# Patient Record
Sex: Female | Born: 1960 | Race: Black or African American | Hispanic: No | Marital: Married | Smoking: Never smoker
Health system: Southern US, Community
[De-identification: ages and names within clinical notes are randomized; demographics above are authoritative.]

## PROBLEM LIST (undated history)

## (undated) HISTORY — PX: TUBAL LIGATION: SHX77

---

## 2001-07-02 ENCOUNTER — Ambulatory Visit (HOSPITAL_COMMUNITY): Admission: RE | Admit: 2001-07-02 | Discharge: 2001-07-02 | Payer: Self-pay

## 2001-09-14 ENCOUNTER — Ambulatory Visit (HOSPITAL_COMMUNITY): Admission: RE | Admit: 2001-09-14 | Discharge: 2001-09-14 | Payer: Self-pay

## 2001-12-23 ENCOUNTER — Ambulatory Visit (HOSPITAL_COMMUNITY): Admission: RE | Admit: 2001-12-23 | Discharge: 2001-12-23 | Payer: Self-pay

## 2002-01-21 ENCOUNTER — Encounter (HOSPITAL_COMMUNITY): Admission: AD | Admit: 2002-01-21 | Discharge: 2002-02-05 | Payer: Self-pay

## 2002-02-08 ENCOUNTER — Inpatient Hospital Stay (HOSPITAL_COMMUNITY): Admission: AD | Admit: 2002-02-08 | Discharge: 2002-02-10 | Payer: Self-pay

## 2002-04-16 ENCOUNTER — Ambulatory Visit (HOSPITAL_COMMUNITY): Admission: RE | Admit: 2002-04-16 | Discharge: 2002-04-16 | Payer: Self-pay

## 2013-02-17 ENCOUNTER — Ambulatory Visit: Payer: Self-pay

## 2013-02-17 ENCOUNTER — Ambulatory Visit: Payer: Self-pay | Admitting: Emergency Medicine

## 2013-02-17 VITALS — BP 114/78 | HR 67 | Temp 97.6°F | Resp 16 | Ht 66.25 in | Wt 172.6 lb

## 2013-02-17 DIAGNOSIS — Z111 Encounter for screening for respiratory tuberculosis: Secondary | ICD-10-CM

## 2013-02-17 DIAGNOSIS — Z0289 Encounter for other administrative examinations: Secondary | ICD-10-CM

## 2013-02-17 NOTE — Progress Notes (Signed)
Subjective:    Patient ID: Traci Grant, female    DOB: October 30, 1960, 52 y.o.   MRN: [id number]  HPI 52 year old female presents for administrative physical for pre-employment. 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 She was born in [place] and has hx of BCG vaccine. No known medical problems or daily medications.   Hepatitis B and MMR up to date.  Last tetanus >10 years ago.     Review of Systems  Constitutional: Negative for fever, chills and unexpected weight change.  Respiratory: Negative for cough.   Cardiovascular: Negative for chest pain.  Gastrointestinal: Negative for abdominal pain.  Skin: Negative for rash.  Neurological: Negative for dizziness and headaches.       Objective:   Physical Exam  Constitutional: She is oriented to person, place, and time. She appears well-developed and well-nourished.  HENT:  Head: Normocephalic and atraumatic.  Right Ear: Hearing, tympanic membrane, external ear and ear canal normal.  Left Ear: Hearing, tympanic membrane, external ear and ear canal normal.  Mouth/Throat: Uvula is midline, oropharynx is clear and moist and mucous membranes are normal.  Eyes: Conjunctivae and EOM are normal. Pupils are equal, round, and reactive to light.  Neck: Normal range of motion. Neck supple.  Cardiovascular: Normal rate, regular rhythm and normal heart sounds.   Pulmonary/Chest: Effort normal and breath sounds normal.  Musculoskeletal: Normal range of motion.       Cervical back: Normal.       Thoracic back: Normal.       Lumbar back: Normal.  Lymphadenopathy:    She has no cervical adenopathy.  Neurological: She is alert and oriented to person, place, and time.  Reflex Scores:      Patellar reflexes are 2+ on the right side and 2+ on the left side. Psychiatric: She has a normal mood and affect. Her behavior is normal. Judgment and thought content normal.   Tuberculosis Risk  Questionnaire  1. Yes ([place]) Were you born outside the [place] in one of the following parts of the world: [place], [place], [place], [place] or [place]?    2. No Have you traveled outside the [place] and lived for more than one month in one of the following parts of the world: [place], [place], [place], [place] or [place]?    3. No Do you have a compromised immune system such as from any of the following conditions:HIV/AIDS, organ or bone marrow transplantation, diabetes, immunosuppressive medicines (e.g. Prednisone, Remicaide), leukemia, lymphoma, cancer of the head or neck, gastrectomy or jejunal bypass, end-stage renal disease (on dialysis), or silicosis?     4. No Have you ever or do you plan on working in: a residential care center, a health care facility, a jail or prison or homeless shelter?    5. No Have you ever: injected illegal drugs, used crack cocaine, lived in a homeless shelter  or been in jail or prison?     6. No Have you ever been exposed to anyone with infectious tuberculosis?    Tuberculosis Symptom Questionnaire  Do you currently have any of the following symptoms?  1. No Unexplained cough lasting more than 3 weeks?   2. No Unexplained fever lasting more than 3 weeks.   3. No Night Sweats (sweating that leaves the bedclothes and sheets wet)     4. No Shortness of Breath   5. No Chest Pain  6. No Unintentional weight loss    7. No Unexplained fatigue (very tired for no reason)      [facility] reading (PRIMARY) by  Dr. Cleta Alberts as no evidence of active TB.       Assessment & Plan:   Other general medical examination for administrative purposes  Screening for tuberculosis - Plan: DG Chest 1 View  Patient declined Tdap today due to cost. Will plan to get this once she has insurance. Understands that her employer may require this before starting. If needed she may return for immunization only.  CXR negative. Letter provided to  patient and forms completed

## 2014-05-10 ENCOUNTER — Ambulatory Visit (INDEPENDENT_AMBULATORY_CARE_PROVIDER_SITE_OTHER): Payer: BC Managed Care – PPO | Admitting: Emergency Medicine

## 2014-05-10 VITALS — BP 128/86 | HR 80 | Temp 97.9°F | Resp 16 | Ht 66.0 in | Wt 167.8 lb

## 2014-05-10 DIAGNOSIS — M25512 Pain in left shoulder: Secondary | ICD-10-CM

## 2014-05-10 DIAGNOSIS — M7552 Bursitis of left shoulder: Secondary | ICD-10-CM

## 2014-05-10 MED ORDER — METHYLPREDNISOLONE ACETATE 80 MG/ML IJ SUSP: 80.0000 mg | Freq: Once | INTRAMUSCULAR | Status: DC

## 2014-05-10 MED ORDER — NAPROXEN SODIUM 550 MG PO TABS
550.0000 mg | ORAL_TABLET | Freq: Two times a day (BID) | ORAL | Status: AC
Start: 2014-05-10 — End: 2015-05-10

## 2014-05-10 NOTE — Patient Instructions (Signed)
bBursitis Bursitis is a swelling and soreness (inflammation) of a fluid-filled sac (bursa) that overlies and protects a joint. It can be caused by injury, overuse of the joint, arthritis or infection. The joints most likely to be affected are the elbows, shoulders, hips and knees. HOME CARE INSTRUCTIONS   Apply ice to the affected area for 15-20 minutes each hour while awake for 2 days. Put the ice in a plastic bag and place a towel between the bag of ice and your skin.  Rest the injured joint as much as possible, but continue to put the joint through a full range of motion, 4 times per day. (The shoulder joint especially becomes rapidly "frozen" if not used.) When the pain lessens, begin normal slow movements and usual activities.  Only take over-the-counter or prescription medicines for pain, discomfort or fever as directed by your caregiver.  Your caregiver may recommend draining the bursa and injecting medicine into the bursa. This may help the healing process.  Follow all instructions for follow-up with your caregiver. This includes any orthopedic referrals, physical therapy and rehabilitation. Any delay in obtaining necessary care could result in a delay or failure of the bursitis to heal and chronic pain. SEEK IMMEDIATE MEDICAL CARE IF:   Your pain increases even during treatment.  You develop an oral temperature above 102 F (38.9 C) and have heat and inflammation over the involved bursa. MAKE SURE YOU:   Understand these instructions.  Will watch your condition.  Will get help right away if you are not doing well or get worse. Document Released: 03/01/2000 Document Revised: 05/27/2011 Document Reviewed: 05/24/2013 [facility] Patient Information 2015 [employer] [place]. This information is not intended to replace advice given to you by your health care provider. Make sure you discuss any questions you have with your health care provider.

## 2014-05-10 NOTE — Progress Notes (Signed)
[facility] [address], [place] [id number] [phone]  Date:  05/10/2014   Name:  Traci Grant   DOB:  1960/06/17   MRN:  [id number]  PCP:  No primary care provider on file.    Chief Complaint: Arm Pain   History of Present Illness:  Traci Grant is a 54 y.o. very pleasant female patient who presents with the following:  Patient has a several month history of pain in left shoulder Can't lay on that side and has increased pain in AM that improves with movement No history of injury or overuse. No crepitus or ecchymosis No radiation of pain. No improvement with over the counter medications or other home remedies.  Denies other complaint or health concern today.   There are no active problems to display for this patient.   No past medical history on file.  No past surgical history on file.  History  Substance Use Topics  . Smoking status: Never Smoker   . Smokeless tobacco: Not on file  . Alcohol Use: No    Family History  Problem Relation Age of Onset  . Hyperlipidemia Father   . Hypertension Father   . Cancer Sister     Allergies  Allergen Reactions  . Lactose Intolerance (Gi) Diarrhea    Medication list has been reviewed and updated.  No current outpatient prescriptions on file prior to visit.   No current facility-administered medications on file prior to visit.    Review of Systems:  As per HPI, otherwise negative.    Physical Examination: Filed Vitals:   05/10/14 1019  BP: 128/86  Pulse: 80  Temp: 97.9 F (36.6 C)  Resp: 16   Filed Vitals:   05/10/14 1019  Height: 5\' 6"  (1.676 m)  Weight: 167 lb 12.8 oz (76.114 kg)   Body mass index is 27.1 kg/(m^2). Ideal Body Weight: Weight in (lb) to have BMI = 25: 154.6   GEN: WDWN, NAD, Non-toxic, Alert & Oriented x 3 HEENT: Atraumatic, Normocephalic.  Ears and Nose: No external deformity. EXTR: No clubbing/cyanosis/edema NEURO: Normal gait.  PSYCH: Normally  interactive. Conversant. Not depressed or anxious appearing.  Calm demeanor.  Left shoulder:  Tender anterior shoulder joint.  Full PROM  Assessment and Plan: left shoulder bursitis Anaprox Local joint injection 80 depo  Signed,  Phillips OdorJeffery Shaliah Wann, MD

## 2014-08-21 ENCOUNTER — Ambulatory Visit (INDEPENDENT_AMBULATORY_CARE_PROVIDER_SITE_OTHER): Payer: BC Managed Care – PPO | Admitting: Emergency Medicine

## 2014-08-21 ENCOUNTER — Ambulatory Visit (INDEPENDENT_AMBULATORY_CARE_PROVIDER_SITE_OTHER): Payer: BC Managed Care – PPO

## 2014-08-21 VITALS — BP 124/78 | HR 71 | Temp 98.1°F | Resp 19 | Ht 66.0 in | Wt 176.2 lb

## 2014-08-21 DIAGNOSIS — M25562 Pain in left knee: Secondary | ICD-10-CM | POA: Diagnosis not present

## 2014-08-21 MED ORDER — HYDROCODONE-ACETAMINOPHEN 5-325 MG PO TABS: 1.0000 | ORAL_TABLET | ORAL | Status: DC | PRN

## 2014-08-21 NOTE — Patient Instructions (Signed)

## 2014-08-21 NOTE — Progress Notes (Signed)
Subjective:  Patient ID: Traci Grant, female    DOB: December 07, 1960  Age: 54 y.o. MRN: [id number]  CC: Leg Pain   HPI Traci Grant [facility] presents  with injuries sustained in a fall. She said that she was descending a stairway and missed a step. She fell injuring her left knee. Pain in her lateral knee. Has pain with flexion of her knee and with weightbearing. He has no effusion. Has no instability of the knee. No locking or clicking. She's not been able to obtain pain relief with her over-the-counter medication. She denies any other injury in the fall. She has no loss of consciousness syncope or dizziness contributed to fall  Outpatient Prescriptions Prior to Visit  Medication Sig Dispense Refill  . naproxen sodium (ANAPROX DS) 550 MG tablet Take 1 tablet (550 mg total) by mouth 2 (two) times daily with a meal. (Patient not taking: Reported on 08/21/2014) 40 tablet 0   Facility-Administered Medications Prior to Visit  Medication Dose Route Frequency Provider Last Rate Last Dose  . methylPREDNISolone acetate (DEPO-MEDROL) injection 80 mg  80 mg Intramuscular Once Carmelina Dane, MD        History   Social History  . Marital Status: Married    Spouse Name: N/A  . Number of Children: N/A  . Years of Education: N/A   Social History Main Topics  . Smoking status: Never Smoker   . Smokeless tobacco: Not on file  . Alcohol Use: No  . Drug Use: No  . Sexual Activity: Not on file   Other Topics Concern  . None   Social History Narrative    Family History  Problem Relation Age of Onset  . Hyperlipidemia Father   . Hypertension Father   . Cancer Sister     History reviewed. No pertinent past medical history.   Review of Systems  Constitutional: Negative for fever, chills and appetite change.  HENT: Negative for congestion, ear pain, postnasal drip, sinus pressure and sore throat.   Eyes: Negative for pain and redness.  Respiratory: Negative for cough, shortness of breath  and wheezing.   Cardiovascular: Negative for leg swelling.  Gastrointestinal: Negative for nausea, vomiting, abdominal pain, diarrhea, constipation and blood in stool.  Endocrine: Negative for polyuria.  Genitourinary: Negative for dysuria, urgency, frequency and flank pain.  Musculoskeletal: Positive for arthralgias. Negative for gait problem.  Skin: Negative for rash.  Neurological: Negative for weakness and headaches.  Psychiatric/Behavioral: Negative for confusion and decreased concentration. The patient is not nervous/anxious.     Objective:  BP 124/78 mmHg  Pulse 71  Temp(Src) 98.1 F (36.7 C) (Oral)  Resp 19  Ht  (1.676 m)  Wt 176 lb 3.2 oz (79.924 kg)  BMI 28.45 kg/m2  SpO2 99%  BP Readings from Last 3 Encounters:  08/21/14 124/78  05/10/14 128/86  02/17/13 114/78    Wt Readings from Last 3 Encounters:  08/21/14 176 lb 3.2 oz (79.924 kg)  05/10/14 167 lb 12.8 oz (76.114 kg)  02/17/13 172 lb 9.6 oz (78.291 kg)    Physical Exam  Constitutional: She is oriented to person, place, and time. She appears well-developed and well-nourished.  HENT:  Head: Normocephalic and atraumatic.  Eyes: Conjunctivae are normal. Pupils are equal, round, and reactive to light.  Neck: Normal range of motion. Neck supple.  Cardiovascular: Normal rate and regular rhythm.   Pulmonary/Chest: Effort normal. No respiratory distress.  Abdominal: Soft.  Musculoskeletal: She exhibits no edema.  Left knee: She exhibits normal range of motion, no swelling, no effusion, no ecchymosis, no deformity, no laceration, normal alignment, no LCL laxity and no MCL laxity. Tenderness found. Lateral joint line tenderness noted.  Neurological: She is alert and oriented to person, place, and time.  Skin: Skin is dry.  Psychiatric: She has a normal mood and affect. Her behavior is normal. Thought content normal.    No results found for: WBC, HGB, HCT, PLT, GLUCOSE, CHOL, TRIG, HDL, LDLDIRECT,  LDLCALC, ALT, AST, NA, K, CL, CREATININE, BUN, CO2, TSH, PSA, INR, GLUF, HGBA1C, MICROALBUR    .  Assessment & Plan:   Traci Grant was seen today for leg pain.  Diagnoses and all orders for this visit:  Pain in joint, lower leg, left   I am having [name] maintain her naproxen sodium and acetaminophen. We will continue to administer methylPREDNISolone acetate.  Meds ordered this encounter  Medications  . acetaminophen (TYLENOL) 500 MG tablet    Sig: Take 500 mg by mouth every 6 (six) hours as needed.    Appropriate red flag conditions were discussed with the patient as well as actions that should be taken.  Patient expressed his understanding.  Follow-up: No Follow-up on file.  Carmelina DaneAnderson, Christoffer Currier S, MD    [facility] reading (PRIMARY) by  Dr. Dareen PianoAnderson. Negative tib-fib..Marland Kitchen

## 2015-06-03 IMAGING — CR DG CHEST 1V
1 series · 1 of 1 positions shown · non-contrast
Comparison: None.

CLINICAL DATA: TB screening.

EXAM:
CHEST - 1 VIEW

[PA]
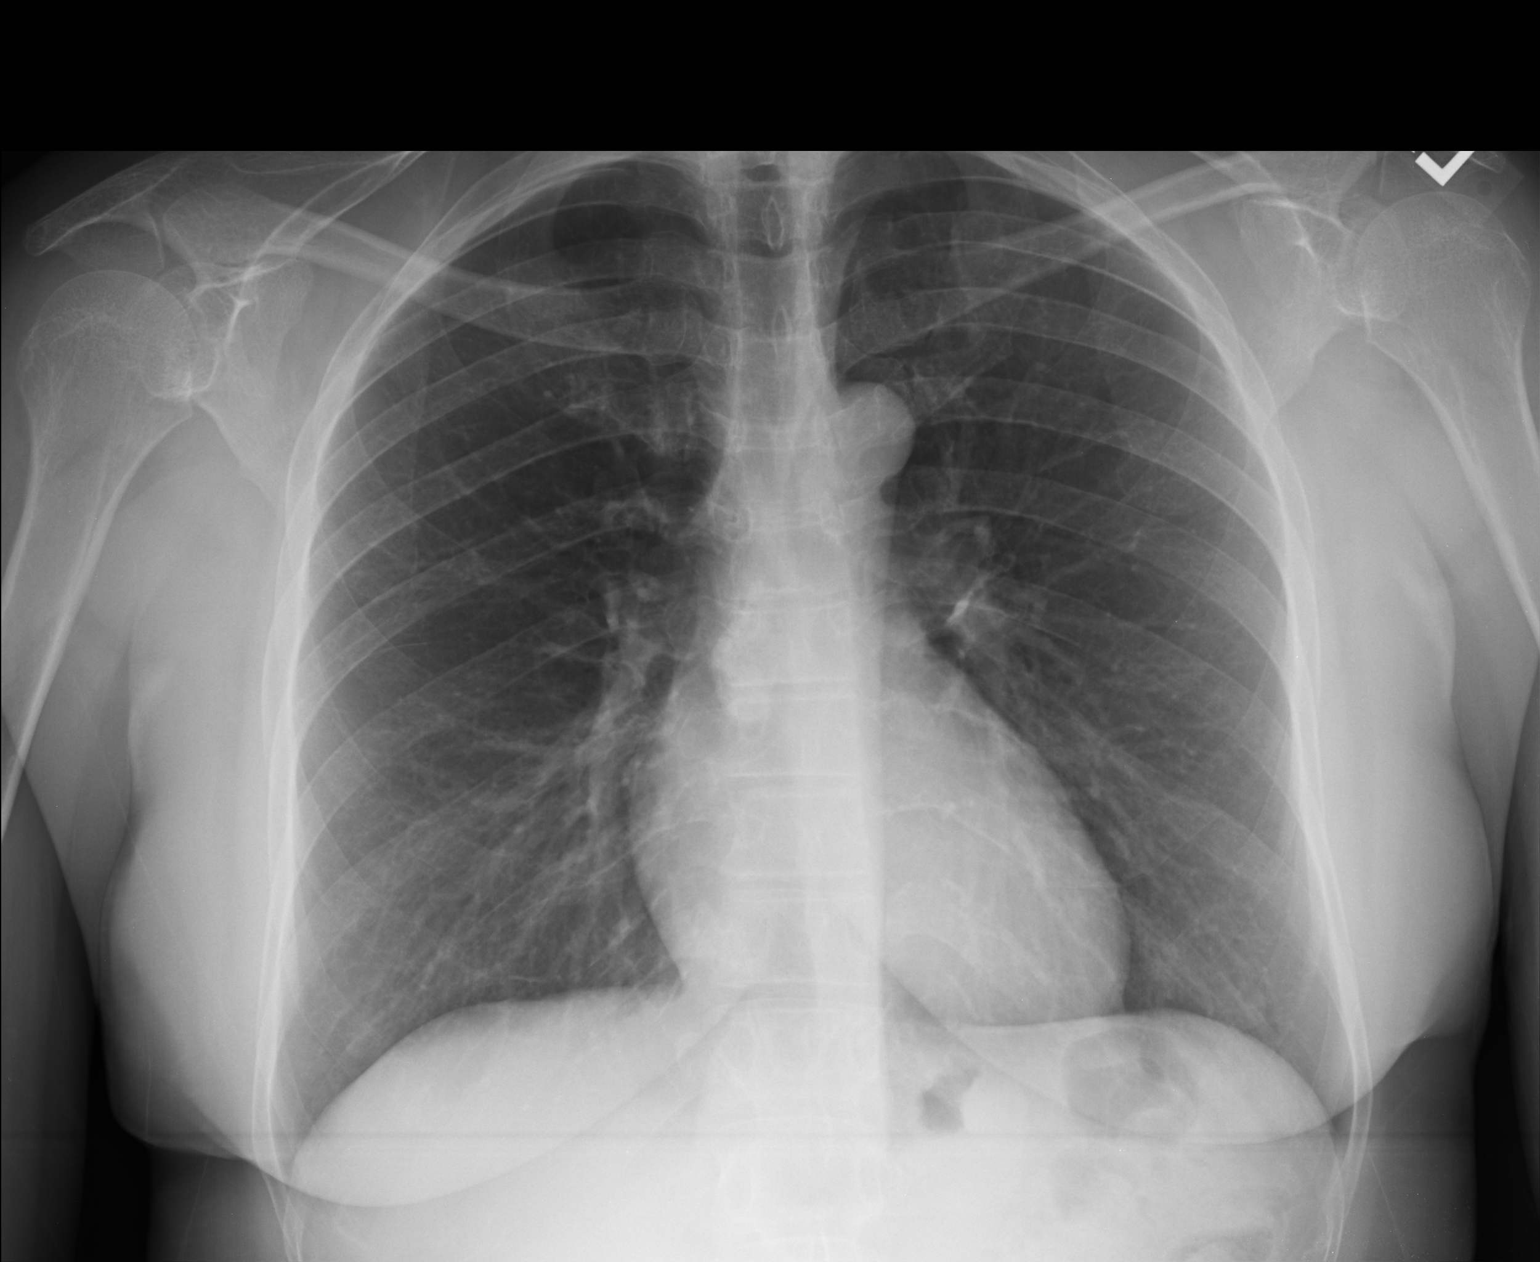

[1 of 1 positions shown; findings below may reference images not displayed]

FINDINGS: Lungs are clear. The cardiomediastinal silhouette is within normal.
Increased ovoid density in the subcarinal region right of midline
likely a calcified lymph node.
IMPRESSION: No acute cardiopulmonary disease.

Findings suggesting prior granulomatous disease.

## 2016-01-26 ENCOUNTER — Ambulatory Visit (INDEPENDENT_AMBULATORY_CARE_PROVIDER_SITE_OTHER): Payer: BC Managed Care – PPO | Admitting: Physician Assistant

## 2016-01-26 VITALS — BP 140/88 | HR 70 | Temp 98.0°F | Resp 16 | Ht 66.0 in | Wt 177.6 lb

## 2016-01-26 DIAGNOSIS — Z5321 Procedure and treatment not carried out due to patient leaving prior to being seen by health care provider: Secondary | ICD-10-CM

## 2016-01-26 NOTE — Patient Instructions (Signed)
     IF you received an x-ray today, you will receive an invoice from [facility]. Please contact [facility] at [phone] with questions or concerns regarding your invoice.   IF you received labwork today, you will receive an invoice from [facility]. Please contact [facility] at [phone] with questions or concerns regarding your invoice.   Our billing staff will not be able to assist you with questions regarding bills from these companies.  You will be contacted with the lab results as soon as they are available. The fastest way to get your results is to activate your My Chart account. Instructions are located on the last page of this paperwork. If you have not heard from us regarding the results in 2 weeks, please contact this office.      

## 2016-01-26 NOTE — Progress Notes (Signed)
Patient left without being seen. Deliah BostonMichael Clark, MS, PA-C 12:29 PM, 01/26/2016

## 2016-03-12 ENCOUNTER — Ambulatory Visit (INDEPENDENT_AMBULATORY_CARE_PROVIDER_SITE_OTHER): Payer: BC Managed Care – PPO | Admitting: Emergency Medicine

## 2016-03-12 DIAGNOSIS — M7552 Bursitis of left shoulder: Secondary | ICD-10-CM

## 2016-03-12 DIAGNOSIS — M25512 Pain in left shoulder: Secondary | ICD-10-CM

## 2016-03-12 MED ORDER — PREDNISONE 20 MG PO TABS: 40.0000 mg | ORAL_TABLET | Freq: Every day | ORAL | 0 refills | Status: AC

## 2016-03-12 MED ORDER — PREDNISONE 20 MG PO TABS: 40.0000 mg | ORAL_TABLET | Freq: Every day | ORAL | 0 refills | Status: DC

## 2016-03-12 NOTE — Patient Instructions (Addendum)
     IF you received an x-ray today, you will receive an invoice from [facility]. Please contact [facility] at [phone] with questions or concerns regarding your invoice.   IF you received labwork today, you will receive an invoice from [facility]. Please contact [facility] at [phone] with questions or concerns regarding your invoice.   Our billing staff will not be able to assist you with questions regarding bills from these companies.  You will be contacted with the lab results as soon as they are available. The fastest way to get your results is to activate your My Chart account. Instructions are located on the last page of this paperwork. If you have not heard from us regarding the results in 2 weeks, please contact this office.      Bursitis Introduction Bursitis is when the fluid-filled sac (bursa) that covers and protects a joint is swollen (inflamed). Bursitis is most common near joints, especially the knees, elbows, hips, and shoulders. Follow these instructions at home:  Take medicines only as told by your doctor.  If you were prescribed an antibiotic medicine, finish it all even if you start to feel better.  Rest the affected area as told by your doctor.  Keep the area raised up.  Avoid doing things that make the pain worse.  Apply ice to the injured area:  Place ice in a plastic bag.  Place a towel between your skin and the bag.  Leave the ice on for 20 minutes, 2-3 times a day.  Use splints, braces, pads, or walking aids as told by your doctor.  Keep all follow-up visits as told by your doctor. This is important. Contact a doctor if:  You have more pain with home care.  You have a fever.  You have chills. This information is not intended to replace advice given to you by your health care provider. Make sure you discuss any questions you have with your health care provider. Document Released: 08/22/2009 Document Revised: 08/10/2015  Document Reviewed: 05/24/2013  2017 Elsevier   Place shoulder pain patient instructions here.

## 2016-03-12 NOTE — Progress Notes (Signed)
Traci Grant is a 55 y.o. female  Shoulder Pain: Patient complaints of left shoulder pain. This is evaluated as a non injury. The pain is described as aching.  The onset of the pain was gradual, starting about 3 days ago.  The pain occurs every day and lasts 24 hours.  Location is anterior, posterior, lateral. Recurrent, symptoms are aggravated by all activities. Symptoms are diminisohed by  rest.   Limited activities include: all activities. moderate stiffness is reported. 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   No past medical history on file. No past surgical history on file.  Current Outpatient Prescriptions:  .  ibuprofen (ADVIL,MOTRIN) 200 MG tablet, Take 200 mg by mouth every 6 (six) hours as needed., Disp: , Rfl:  .  acetaminophen (TYLENOL) 500 MG tablet, Take 500 mg by mouth every 6 (six) hours as needed., Disp: , Rfl:   Current Facility-Administered Medications:  .  methylPREDNISolone acetate (DEPO-MEDROL) injection 80 mg, 80 mg, Intramuscular, Once, Carmelina DaneJeffery S Anderson, MD Allergies  Allergen Reactions  . Lactose Intolerance (Gi) Diarrhea    reports that she has never smoked. She does not have any smokeless tobacco history on file. She reports that she does not drink alcohol or use drugs. Family History  Problem Relation Age of Onset  . Hyperlipidemia Father   . Hypertension Father   . Cancer Sister   Review of Systems  Constitutional: Negative.   HENT: Negative.   Eyes: Negative.   Respiratory: Negative.   Cardiovascular: Negative.   Gastrointestinal: Negative.   Genitourinary: Negative.   Musculoskeletal: Positive for joint pain (left shoulder).  Skin: Negative.   Neurological: Negative.   Endo/Heme/Allergies: Negative.   Psychiatric/Behavioral: Negative.    Vitals:   03/12/16 1036  BP: 136/84  Pulse: 89  Resp: 16  Temp: 98.1 F (36.7 C)    Physical Exam  Constitutional: She is oriented to person, place, and time. She appears well-developed  and well-nourished.  HENT:  Head: Normocephalic and atraumatic.  Eyes: Conjunctivae and EOM are normal. Pupils are equal, round, and reactive to light.  Neck: Normal range of motion. Neck supple.  Cardiovascular: Normal rate, regular rhythm, normal heart sounds and intact distal pulses.   Pulmonary/Chest: Effort normal and breath sounds normal.  Abdominal: Soft.  Musculoskeletal:  Left shoulder: tender, no erythema, very limited ROM  Neurological: She is alert and oriented to person, place, and time.  Skin: Skin is warm and dry.  Psychiatric: She has a normal mood and affect. Her behavior is normal.    Traci Grant was seen today for shoulder pain.  Diagnoses and all orders for this visit:  Bursitis of left shoulder -     Ambulatory referral to Orthopedics -     Discontinue: predniSONE (DELTASONE) 20 MG tablet; Take 2 tablets (40 mg total) by mouth daily with breakfast. -     Care order/instruction: -     Sling immobilizer -     predniSONE (DELTASONE) 20 MG tablet; Take 2 tablets (40 mg total) by mouth daily with breakfast.  Acute pain of left shoulder   Left shoulder bursitis Bursitis instructions given. Start Prednisone 40mg  day x 5 days. F/U with Orthopedist.

## 2016-04-03 ENCOUNTER — Ambulatory Visit (INDEPENDENT_AMBULATORY_CARE_PROVIDER_SITE_OTHER): Payer: BC Managed Care – PPO | Admitting: Orthopedic Surgery

## 2016-12-04 IMAGING — CR DG TIBIA/FIBULA 2V*L*
2 series · 2 of 2 positions shown · non-contrast
Comparison: None.

CLINICAL DATA: Left knee pain secondary to a fall on stairs.

EXAM:
LEFT TIBIA AND FIBULA - 2 VIEW

[AP]
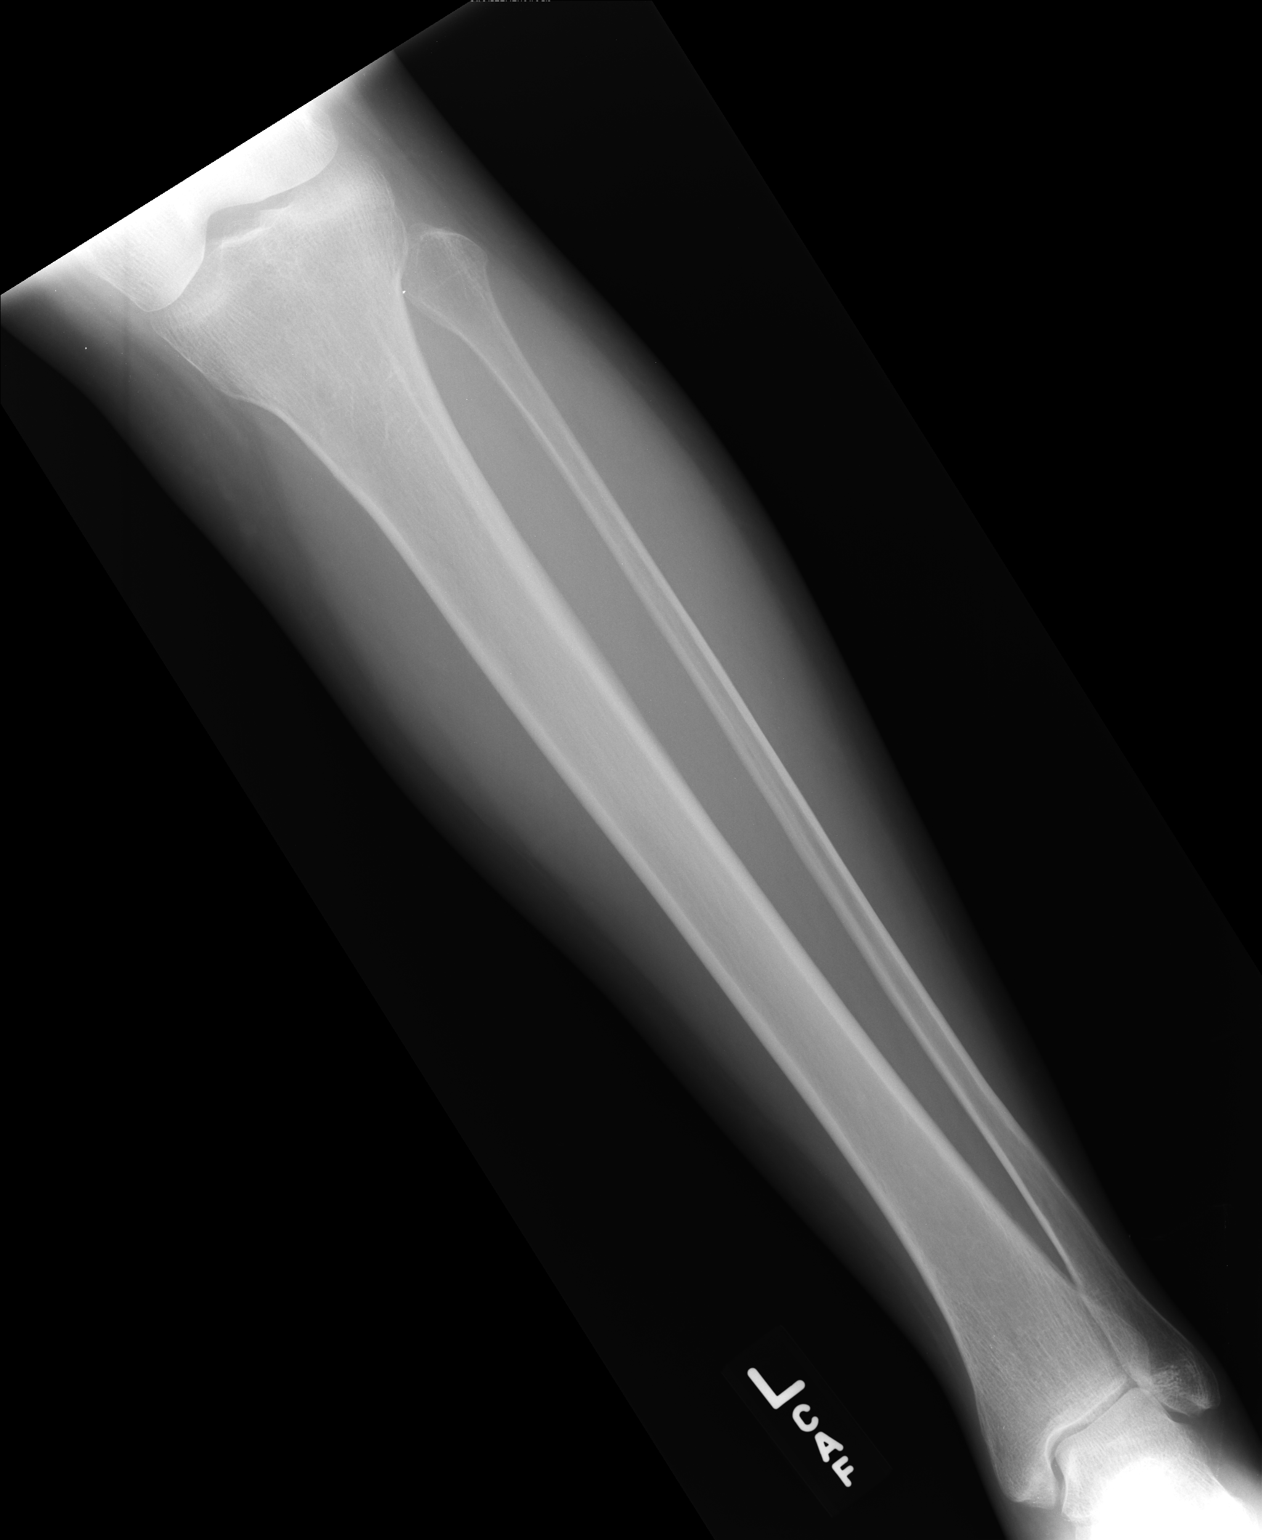

[lateral]
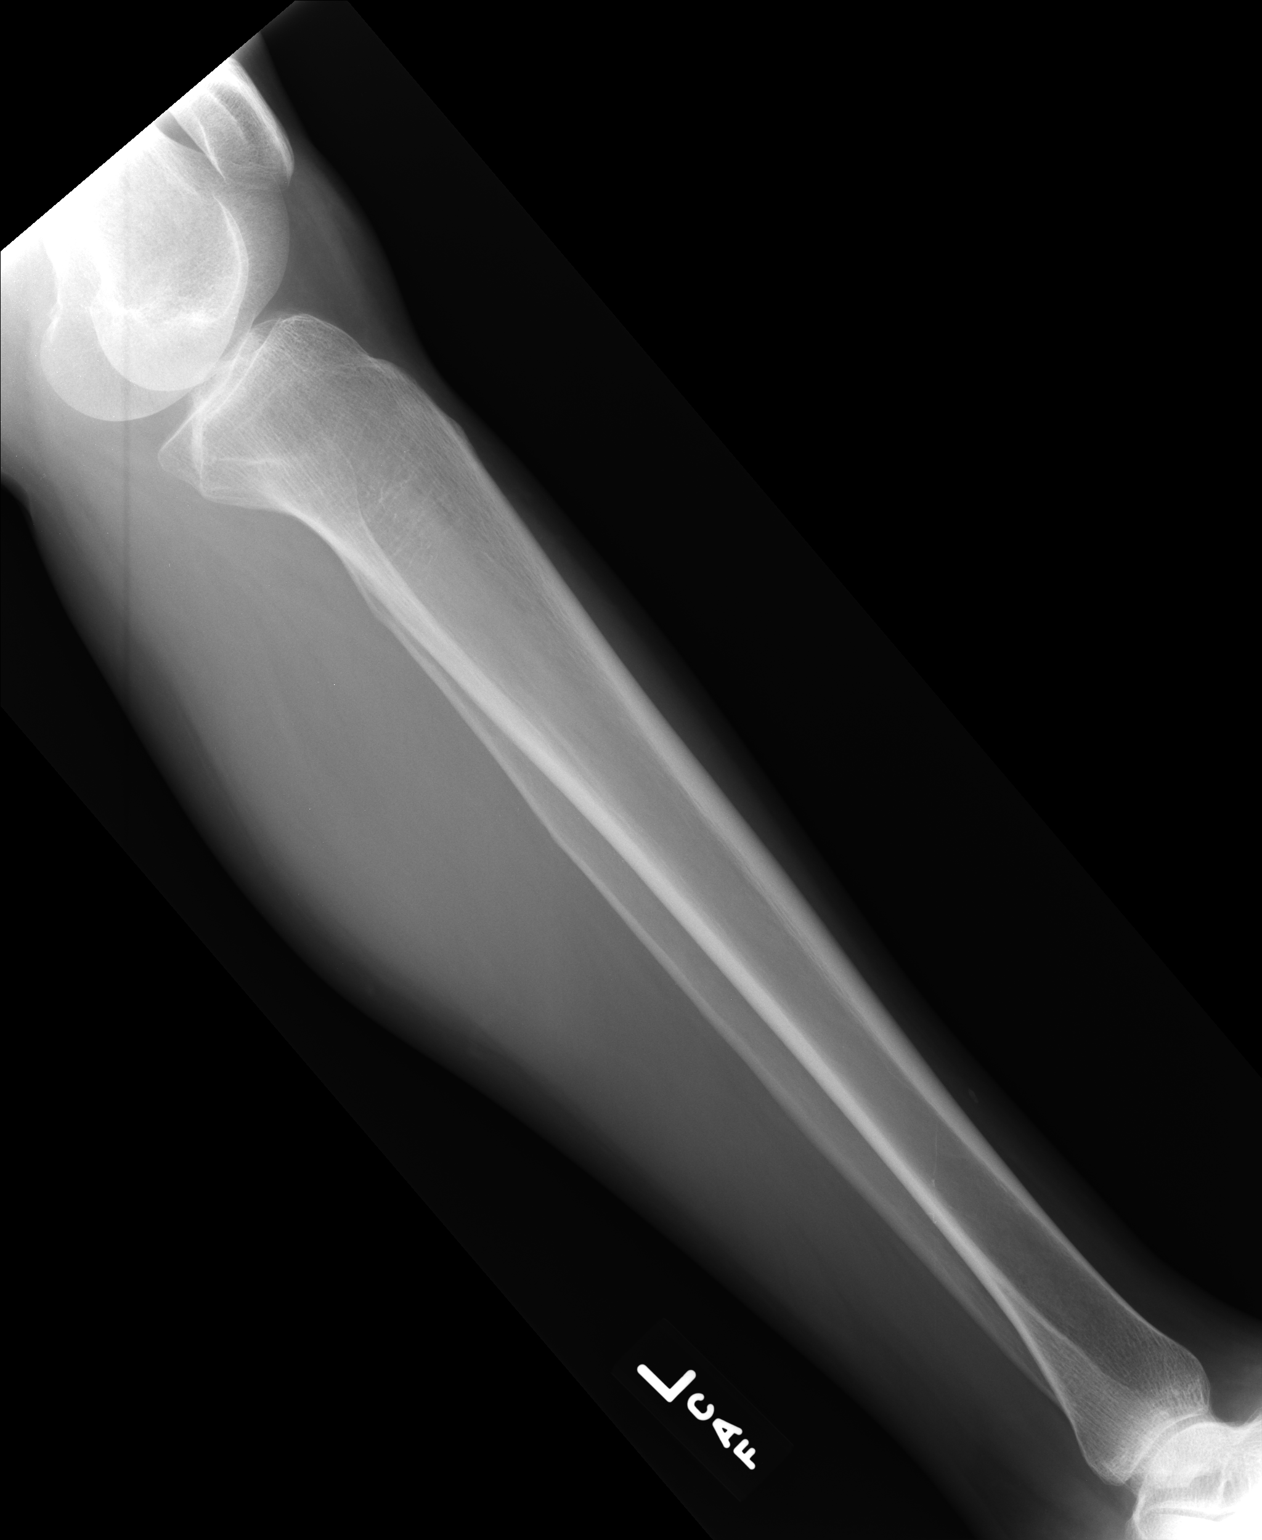

[2 of 2 positions shown; findings below may reference images not displayed]

FINDINGS: There is no evidence of fracture or other focal bone lesions. Soft
tissues are unremarkable.
IMPRESSION: Normal exam.

## 2017-01-29 ENCOUNTER — Ambulatory Visit: Payer: BC Managed Care – PPO | Admitting: Emergency Medicine

## 2017-01-29 ENCOUNTER — Encounter: Payer: Self-pay | Admitting: Emergency Medicine

## 2017-01-29 ENCOUNTER — Other Ambulatory Visit: Payer: Self-pay

## 2017-01-29 VITALS — BP 112/78 | HR 71 | Temp 98.3°F | Resp 16 | Ht 65.5 in | Wt 183.8 lb

## 2017-01-29 DIAGNOSIS — S91332A Puncture wound without foreign body, left foot, initial encounter: Secondary | ICD-10-CM

## 2017-01-29 DIAGNOSIS — Z23 Encounter for immunization: Secondary | ICD-10-CM | POA: Diagnosis not present

## 2017-01-29 MED ORDER — CIPROFLOXACIN HCL 500 MG PO TABS: 500.0000 mg | ORAL_TABLET | Freq: Two times a day (BID) | ORAL | 0 refills | Status: DC

## 2017-01-29 NOTE — Progress Notes (Signed)
Traci Grant 56 y.o.   Chief Complaint  Patient presents with  . Foot Injury    LEFT -per patient stepped on nail last night and wants Td or Tdap    HISTORY OF PRESENT ILLNESS: This is a 56 y.o. female complaining of puncture wound to left foot last night; needs Td.  HPI   Prior to Admission medications   Medication Sig Start Date End Date Taking? Authorizing Provider  ibuprofen (ADVIL,MOTRIN) 200 MG tablet Take 200 mg by mouth every 6 (six) hours as needed.   Yes [provider]  acetaminophen (TYLENOL) 500 MG tablet Take 500 mg by mouth every 6 (six) hours as needed.    [provider]    Allergies  Allergen Reactions  . Lactose Intolerance (Gi) Diarrhea    There are no active problems to display for this patient.   No past medical history on file.  No past surgical history on file.  Social History   Socioeconomic History  . Marital status: Married    Spouse name: Not on file  . Number of children: Not on file  . Years of education: Not on file  . Highest education level: Not on file  Social Needs  . Financial resource strain: Not on file  . Food insecurity - worry: Not on file  . Food insecurity - inability: Not on file  . Transportation needs - medical: Not on file  . Transportation needs - non-medical: Not on file  Occupational History  . Not on file  Tobacco Use  . Smoking status: Never Smoker  . Smokeless tobacco: Never Used  Substance and Sexual Activity  . Alcohol use: No  . Drug use: No  . Sexual activity: Not on file  Other Topics Concern  . Not on file  Social History Narrative  . Not on file    Family History  Problem Relation Age of Onset  . Hyperlipidemia Father   . Hypertension Father   . Cancer Sister      Review of Systems  Constitutional: Negative.  Negative for chills and fever.  Respiratory: Negative for shortness of breath.   Gastrointestinal: Negative for nausea and vomiting.  Neurological: Negative  for dizziness and headaches.  All other systems reviewed and are negative.  Vitals:   01/29/17 1652  BP: 112/78  Pulse: 71  Resp: 16  Temp: 98.3 F (36.8 C)  SpO2: 99%     Physical Exam  Constitutional: She is oriented to person, place, and time. She appears well-developed and well-nourished.  HENT:  Head: Normocephalic and atraumatic.  Eyes: EOM are normal. Pupils are equal, round, and reactive to light.  Neck: Normal range of motion.  Cardiovascular: Normal rate.  Pulmonary/Chest: Effort normal.  Musculoskeletal:  Left foot: +puncture wound distal plantar surface 1-2 MTP area  Neurological: She is alert and oriented to person, place, and time.  Skin: Skin is warm. Capillary refill takes less than 2 seconds.  Psychiatric: She has a normal mood and affect. Her behavior is normal.  Vitals reviewed.    ASSESSMENT & PLAN: Traci Grant was seen today for foot injury.  Diagnoses and all orders for this visit:  Puncture wound of left foot, initial encounter -     Tdap vaccine greater than or equal to 7yo IM  Need for Tdap vaccination -     Tdap vaccine greater than or equal to 7yo IM  Other orders -     ciprofloxacin (CIPRO) 500 MG tablet; Take 1 tablet (  500 mg total) 2 (two) times daily by mouth.    Patient Instructions   We recommend that you schedule a mammogram for breast cancer screening. Typically, you do not need a referral to do this. Please contact a local imaging center to schedule your mammogram.  [facility] - [phone]  *ask for the Radiology Department [facility]) - [phone]) [phone] or [phone]  [facility] - [phone] [facility] - [phone]) [phone] [facility] - [phone]  *ask for the Radiology Department [facility] - [phone]  *ask for the Radiology Department [facility] - [phone]  *ask for the Mammography Department [facility]  - [phone]    IF you received an x-ray today, you will receive an invoice from [facility]. Please contact [facility] at [phone] with questions or concerns regarding your invoice.   IF you received labwork today, you will receive an invoice from [facility]. Please contact [facility] at [phone] with questions or concerns regarding your invoice.   Our billing staff will not be able to assist you with questions regarding bills from these companies.  You will be contacted with the lab results as soon as they are available. The fastest way to get your results is to activate your My Chart account. Instructions are located on the last page of this paperwork. If you have not heard from us regarding the results in 2 weeks, please contact this office.     Puncture Wound A puncture wound is an injury that is caused by a sharp, thin object that goes through your skin, such as a nail. A puncture wound usually does not leave a large opening in your skin, so it may not bleed a lot. However, when you get a puncture wound, dirt or other materials (foreign bodies) can be forced into your wound and break off inside. This makes it more likely that an infection will happen, such as tetanus. Follow these instructions at home: Medicines  Take or apply over-the-counter and prescription medicines only as told by your doctor.  If you were prescribed an antibiotic medicine, take or apply it as told by your doctor. Do not stop using the antibiotic even if your condition starts to get better. Wound care  There are many ways to close and cover a wound. For example, a wound can be covered with stitches (sutures), skin glue, or adhesive strips. Follow instructions from your doctor about: ? How to take care of your wound. ? When and how you should change your bandage (dressing). ? When you should remove your bandage. ? Removing whatever was used to close your wound.  Keep the bandage dry  as told by your doctor. Do not take baths, swim, use a hot tub, or do anything that would put your wound underwater until your doctor says it is okay.  Clean the wound as told by your doctor.  Do not scratch or pick at the wound.  Check your wound every day for signs of infection. Watch for: ? Redness, swelling, or pain. ? Fluid, blood, or pus. General instructions  Raise (elevate) the injured area above the level of your heart while you are sitting or lying down.  If your puncture wound is in your foot, ask your doctor if you need to avoid putting weight on your foot and for how long.  Keep all follow-up visits as told by your doctor. This is important. Contact a doctor if:  You got a tetanus shot and you have any of these problems at the injection site: ? Swelling. ? Very bad pain. ? Redness. ? Bleeding.  You have a fever.  Your stitches come out.  You notice a bad smell coming from your wound or your bandage.  You notice something coming out of the wound, such as wood or glass.  Medicine does not help your pain.  You have more redness, swelling, or pain at the site of your wound.  You have fluid, blood, or pus coming from your wound.  You notice a change in the color of your skin near your wound.  You need to change the bandage often because fluid, blood, or pus is coming from the wound.  You start to have a new rash.  You start to have numbness around the wound. Get help right away if:  You have very bad swelling around the wound.  Your pain suddenly gets worse and is very bad.  You start to get painful skin lumps.  You have a red streak going away from your wound.  The wound is on your hand or foot and you cannot move a finger or toe like you usually can.  The wound is on your hand or foot and you notice that your fingers or toes look pale or bluish. This information is not intended to replace advice given to you by your health care provider. Make sure  you discuss any questions you have with your health care provider. Document Released: 12/12/2007 Document Revised: 08/10/2015 Document Reviewed: 04/27/2014 Elsevier Interactive Patient Education  2018 Elsevier Inc.      Edwina Barth, MD [facility]

## 2017-01-29 NOTE — Patient Instructions (Addendum)
We recommend that you schedule a mammogram for breast cancer screening. Typically, you do not need a referral to do this. Please contact a local imaging center to schedule your mammogram.  [facility] - [phone]  *ask for the Radiology Department [facility]) - [phone](336) [phone] or [phone]) [phone]  [facility] - [phone] [facility] - [phone] [facility] - [phone]) [phone]  *ask for the Radiology Department [facility] - [phone]  *ask for the Radiology Department [facility] - [phone]) [phone]  *ask for the Mammography Department [facility] - [phone]    IF you received an x-ray today, you will receive an invoice from [facility]. Please contact [facility] at [phone] with questions or concerns regarding your invoice.   IF you received labwork today, you will receive an invoice from [facility]. Please contact [facility] at [phone] with questions or concerns regarding your invoice.   Our billing staff will not be able to assist you with questions regarding bills from these companies.  You will be contacted with the lab results as soon as they are available. The fastest way to get your results is to activate your My Chart account. Instructions are located on the last page of this paperwork. If you have not heard from us regarding the results in 2 weeks, please contact this office.     Puncture Wound A puncture wound is an injury that is caused by a sharp, thin object that goes through your skin, such as a nail. A puncture wound usually does not leave a large opening in your skin, so it may not bleed a lot. However, when you get a puncture wound, dirt or other materials (foreign bodies) can be forced into your wound and break off inside. This makes it more likely that an infection will happen, such as tetanus. Follow these instructions at  home: Medicines  Take or apply over-the-counter and prescription medicines only as told by your doctor.  If you were prescribed an antibiotic medicine, take or apply it as told by your doctor. Do not stop using the antibiotic even if your condition starts to get better. Wound care  There are many ways to close and cover a wound. For example, a wound can be covered with stitches (sutures), skin glue, or adhesive strips. Follow instructions from your doctor about: ? How to take care of your wound. ? When and how you should change your bandage (dressing). ? When you should remove your bandage. ? Removing whatever was used to close your wound.  Keep the bandage dry as told by your doctor. Do not take baths, swim, use a hot tub, or do anything that would put your wound underwater until your doctor says it is okay.  Clean the wound as told by your doctor.  Do not scratch or pick at the wound.  Check your wound every day for signs of infection. Watch for: ? Redness, swelling, or pain. ? Fluid, blood, or pus. General instructions  Raise (elevate) the injured area above the level of your heart while you are sitting or lying down.  If your puncture wound is in your foot, ask your doctor if you need to avoid putting weight on your foot and for how long.  Keep all follow-up visits as told by your doctor. This is important. Contact a doctor if:  You got a tetanus shot and you have any of these problems at the injection site: ?  Swelling. ? Very bad pain. ? Redness. ? Bleeding.  You have a fever.  Your stitches come out.  You notice a bad smell coming from your wound or your bandage.  You notice something coming out of the wound, such as wood or glass.  Medicine does not help your pain.  You have more redness, swelling, or pain at the site of your wound.  You have fluid, blood, or pus coming from your wound.  You notice a change in the color of your skin near your wound.  You  need to change the bandage often because fluid, blood, or pus is coming from the wound.  You start to have a new rash.  You start to have numbness around the wound. Get help right away if:  You have very bad swelling around the wound.  Your pain suddenly gets worse and is very bad.  You start to get painful skin lumps.  You have a red streak going away from your wound.  The wound is on your hand or foot and you cannot move a finger or toe like you usually can.  The wound is on your hand or foot and you notice that your fingers or toes look pale or bluish. This information is not intended to replace advice given to you by your health care provider. Make sure you discuss any questions you have with your health care provider. Document Released: 12/12/2007 Document Revised: 08/10/2015 Document Reviewed: 04/27/2014 Elsevier Interactive Patient Education  [employer]2018 [employer]

## 2017-03-21 ENCOUNTER — Ambulatory Visit: Payer: BC Managed Care – PPO | Admitting: Emergency Medicine

## 2017-03-25 ENCOUNTER — Encounter: Payer: BC Managed Care – PPO | Admitting: Emergency Medicine

## 2017-03-28 ENCOUNTER — Other Ambulatory Visit: Payer: Self-pay

## 2017-03-28 ENCOUNTER — Telehealth: Payer: Self-pay | Admitting: *Deleted

## 2017-03-28 ENCOUNTER — Encounter: Payer: Self-pay | Admitting: Emergency Medicine

## 2017-03-28 ENCOUNTER — Ambulatory Visit (INDEPENDENT_AMBULATORY_CARE_PROVIDER_SITE_OTHER): Payer: BC Managed Care – PPO | Admitting: Emergency Medicine

## 2017-03-28 VITALS — BP 130/70 | HR 79 | Temp 98.3°F | Resp 16 | Ht 65.75 in | Wt 177.4 lb

## 2017-03-28 DIAGNOSIS — Z0000 Encounter for general adult medical examination without abnormal findings: Secondary | ICD-10-CM | POA: Diagnosis not present

## 2017-03-28 NOTE — Patient Instructions (Addendum)
   IF you received an x-ray today, you will receive an invoice from [facility]. Please contact [facility] at [phone] with questions or concerns regarding your invoice.   IF you received labwork today, you will receive an invoice from [facility]. Please contact [facility] at [phone] with questions or concerns regarding your invoice.   Our billing staff will not be able to assist you with questions regarding bills from these companies.  You will be contacted with the lab results as soon as they are available. The fastest way to get your results is to activate your My Chart account. Instructions are located on the last page of this paperwork. If you have not heard from us regarding the results in 2 weeks, please contact this office.     Health Maintenance, Female Adopting a healthy lifestyle and getting preventive care can go a long way to promote health and wellness. Talk with your health care provider about what schedule of regular examinations is right for you. This is a good chance for you to check in with your provider about disease prevention and staying healthy. In between checkups, there are plenty of things you can do on your own. Experts have done a lot of research about which lifestyle changes and preventive measures are most likely to keep you healthy. Ask your health care provider for more information. Weight and diet Eat a healthy diet  Be sure to include plenty of vegetables, fruits, low-fat dairy products, and lean protein.  Do not eat a lot of foods high in solid fats, added sugars, or salt.  Get regular exercise. This is one of the most important things you can do for your health. ? Most adults should exercise for at least 150 minutes each week. The exercise should increase your heart rate and make you sweat (moderate-intensity exercise). ? Most adults should also do strengthening exercises at least twice a week. This is in addition to the  moderate-intensity exercise.  Maintain a healthy weight  Body mass index (BMI) is a measurement that can be used to identify possible weight problems. It estimates body fat based on height and weight. Your health care provider can help determine your BMI and help you achieve or maintain a healthy weight.  For females 20 years of age and older: ? A BMI below 18.5 is considered underweight. ? A BMI of 18.5 to 24.9 is normal. ? A BMI of 25 to 29.9 is considered overweight. ? A BMI of 30 and above is considered obese.  Watch levels of cholesterol and blood lipids  You should start having your blood tested for lipids and cholesterol at 56 years of age, then have this test every 5 years.  You may need to have your cholesterol levels checked more often if: ? Your lipid or cholesterol levels are high. ? You are older than 56 years of age. ? You are at high risk for heart disease.  Cancer screening Lung Cancer  Lung cancer screening is recommended for adults 55-80 years old who are at high risk for lung cancer because of a history of smoking.  A yearly low-dose CT scan of the lungs is recommended for people who: ? Currently smoke. ? Have quit within the past 15 years. ? Have at least a 30-pack-year history of smoking. A pack year is smoking an average of one pack of cigarettes a day for 1 year.  Yearly screening should continue until it has been 15 years since you quit.  Yearly   screening should stop if you develop a health problem that would prevent you from having lung cancer treatment.  Breast Cancer  Practice breast self-awareness. This means understanding how your breasts normally appear and feel.  It also means doing regular breast self-exams. Let your health care provider know about any changes, no matter how small.  If you are in your 20s or 30s, you should have a clinical breast exam (CBE) by a health care provider every 1-3 years as part of a regular health exam.  If you  are 73 or older, have a CBE every year. Also consider having a breast X-ray (mammogram) every year.  If you have a family history of breast cancer, talk to your health care provider about genetic screening.  If you are at high risk for breast cancer, talk to your health care provider about having an MRI and a mammogram every year.  Breast cancer gene (BRCA) assessment is recommended for women who have family members with BRCA-related cancers. BRCA-related cancers include: ? Breast. ? Ovarian. ? Tubal. ? Peritoneal cancers.  Results of the assessment will determine the need for genetic counseling and BRCA1 and BRCA2 testing.  Cervical Cancer Your health care provider may recommend that you be screened regularly for cancer of the pelvic organs (ovaries, uterus, and vagina). This screening involves a pelvic examination, including checking for microscopic changes to the surface of your cervix (Pap test). You may be encouraged to have this screening done every 3 years, beginning at age 61.  For women ages 90-65, health care providers may recommend pelvic exams and Pap testing every 3 years, or they may recommend the Pap and pelvic exam, combined with testing for human papilloma virus (HPV), every 5 years. Some types of HPV increase your risk of cervical cancer. Testing for HPV may also be done on women of any age with unclear Pap test results.  Other health care providers may not recommend any screening for nonpregnant women who are considered low risk for pelvic cancer and who do not have symptoms. Ask your health care provider if a screening pelvic exam is right for you.  If you have had past treatment for cervical cancer or a condition that could lead to cancer, you need Pap tests and screening for cancer for at least 20 years after your treatment. If Pap tests have been discontinued, your risk factors (such as having a new sexual partner) need to be reassessed to determine if screening should  resume. Some women have medical problems that increase the chance of getting cervical cancer. In these cases, your health care provider may recommend more frequent screening and Pap tests.  Colorectal Cancer  This type of cancer can be detected and often prevented.  Routine colorectal cancer screening usually begins at 56 years of age and continues through 56 years of age.  Your health care provider may recommend screening at an earlier age if you have risk factors for colon cancer.  Your health care provider may also recommend using home test kits to check for hidden blood in the stool.  A small camera at the end of a tube can be used to examine your colon directly (sigmoidoscopy or colonoscopy). This is done to check for the earliest forms of colorectal cancer.  Routine screening usually begins at age 67.  Direct examination of the colon should be repeated every 5-10 years through 56 years of age. However, you may need to be screened more often if early forms of precancerous polyps  or small growths are found.  Skin Cancer  Check your skin from head to toe regularly.  Tell your health care provider about any new moles or changes in moles, especially if there is a change in a mole's shape or color.  Also tell your health care provider if you have a mole that is larger than the size of a pencil eraser.  Always use sunscreen. Apply sunscreen liberally and repeatedly throughout the day.  Protect yourself by wearing long sleeves, pants, a wide-brimmed hat, and sunglasses whenever you are outside.  Heart disease, diabetes, and high blood pressure  High blood pressure causes heart disease and increases the risk of stroke. High blood pressure is more likely to develop in: ? People who have blood pressure in the high end of the normal range (130-139/85-89 mm Hg). ? People who are overweight or obese. ? People who are African American.  If you are 18-39 years of age, have your blood  pressure checked every 3-5 years. If you are 40 years of age or older, have your blood pressure checked every year. You should have your blood pressure measured twice-once when you are at a hospital or clinic, and once when you are not at a hospital or clinic. Record the average of the two measurements. To check your blood pressure when you are not at a hospital or clinic, you can use: ? An automated blood pressure machine at a pharmacy. ? A home blood pressure monitor.  If you are between 55 years and 79 years old, ask your health care provider if you should take aspirin to prevent strokes.  Have regular diabetes screenings. This involves taking a blood sample to check your fasting blood sugar level. ? If you are at a normal weight and have a low risk for diabetes, have this test once every three years after 56 years of age. ? If you are overweight and have a high risk for diabetes, consider being tested at a younger age or more often. Preventing infection Hepatitis B  If you have a higher risk for hepatitis B, you should be screened for this virus. You are considered at high risk for hepatitis B if: ? You were born in a country where hepatitis B is common. Ask your health care provider which countries are considered high risk. ? Your parents were born in a high-risk country, and you have not been immunized against hepatitis B (hepatitis B vaccine). ? You have HIV or AIDS. ? You use needles to inject street drugs. ? You live with someone who has hepatitis B. ? You have had sex with someone who has hepatitis B. ? You get hemodialysis treatment. ? You take certain medicines for conditions, including cancer, organ transplantation, and autoimmune conditions.  Hepatitis C  Blood testing is recommended for: ? Everyone born from 1945 through 1965. ? Anyone with known risk factors for hepatitis C.  Sexually transmitted infections (STIs)  You should be screened for sexually transmitted  infections (STIs) including gonorrhea and chlamydia if: ? You are sexually active and are younger than 56 years of age. ? You are older than 56 years of age and your health care provider tells you that you are at risk for this type of infection. ? Your sexual activity has changed since you were last screened and you are at an increased risk for chlamydia or gonorrhea. Ask your health care provider if you are at risk.  If you do not have HIV, but are at risk,   it may be recommended that you take a prescription medicine daily to prevent HIV infection. This is called pre-exposure prophylaxis (PrEP). You are considered at risk if: ? You are sexually active and do not regularly use condoms or know the HIV status of your partner(s). ? You take drugs by injection. ? You are sexually active with a partner who has HIV.  Talk with your health care provider about whether you are at high risk of being infected with HIV. If you choose to begin PrEP, you should first be tested for HIV. You should then be tested every 3 months for as long as you are taking PrEP. Pregnancy  If you are premenopausal and you may become pregnant, ask your health care provider about preconception counseling.  If you may become pregnant, take 400 to 800 micrograms (mcg) of folic acid every day.  If you want to prevent pregnancy, talk to your health care provider about birth control (contraception). Osteoporosis and menopause  Osteoporosis is a disease in which the bones lose minerals and strength with aging. This can result in serious bone fractures. Your risk for osteoporosis can be identified using a bone density scan.  If you are 65 years of age or older, or if you are at risk for osteoporosis and fractures, ask your health care provider if you should be screened.  Ask your health care provider whether you should take a calcium or vitamin D supplement to lower your risk for osteoporosis.  Menopause may have certain physical  symptoms and risks.  Hormone replacement therapy may reduce some of these symptoms and risks. Talk to your health care provider about whether hormone replacement therapy is right for you. Follow these instructions at home:  Schedule regular health, dental, and eye exams.  Stay current with your immunizations.  Do not use any tobacco products including cigarettes, chewing tobacco, or electronic cigarettes.  If you are pregnant, do not drink alcohol.  If you are breastfeeding, limit how much and how often you drink alcohol.  Limit alcohol intake to no more than 1 drink per day for nonpregnant women. One drink equals 12 ounces of beer, 5 ounces of wine, or 1 ounces of hard liquor.  Do not use street drugs.  Do not share needles.  Ask your health care provider for help if you need support or information about quitting drugs.  Tell your health care provider if you often feel depressed.  Tell your health care provider if you have ever been abused or do not feel safe at home. This information is not intended to replace advice given to you by your health care provider. Make sure you discuss any questions you have with your health care provider. Document Released: 09/17/2010 Document Revised: 08/10/2015 Document Reviewed: 12/06/2014 Elsevier Interactive Patient Education  2018 [employer]  American Heart Association (AHA) Exercise Recommendation  Being physically active is important to prevent heart disease and stroke, the nation's No. 1and No. 5killers. To improve overall cardiovascular health, we suggest at least 150 minutes per week of moderate exercise or 75 minutes per week of vigorous exercise (or a combination of moderate and vigorous activity). Thirty minutes a day, five times a week is an easy goal to remember. You will also experience benefits even if you divide your time into two or three segments of 10 to 15 minutes per day.  For people who would benefit from lowering their  blood pressure or cholesterol, we recommend 40 minutes of aerobic exercise of moderate to   vigorous intensity three to four times a week to lower the risk for heart attack and stroke.  Physical activity is anything that makes you move your body and burn calories.  This includes things like climbing stairs or playing sports. Aerobic exercises benefit your heart, and include walking, jogging, swimming or biking. Strength and stretching exercises are best for overall stamina and flexibility.  The simplest, positive change you can make to effectively improve your heart health is to start walking. It's enjoyable, free, easy, social and great exercise. A walking program is flexible and boasts high success rates because people can stick with it. It's easy for walking to become a regular and satisfying part of life.   For Overall Cardiovascular Health:  At least 30 minutes of moderate-intensity aerobic activity at least 5 days per week for a total of 150  OR   At least 25 minutes of vigorous aerobic activity at least 3 days per week for a total of 75 minutes; or a combination of moderate- and vigorous-intensity aerobic activity  AND   Moderate- to high-intensity muscle-strengthening activity at least 2 days per week for additional health benefits.  For Lowering Blood Pressure and Cholesterol  An average 40 minutes of moderate- to vigorous-intensity aerobic activity 3 or 4 times per week  What if I can't make it to the time goal? Something is always better than nothing! And everyone has to start somewhere. Even if you've been sedentary for years, today is the day you can begin to make healthy changes in your life. If you don't think you'll make it for 30 or 40 minutes, set a reachable goal for today. You can work up toward your overall goal by increasing your time as you get stronger. Don't let all-or-nothing thinking rob you of doing what you can every day.  Source:http://www.heart.org

## 2017-03-28 NOTE — Telephone Encounter (Signed)
Patient was here for office visit and inquired about forms she left to be completed by Dr Alvy BimlerSagardia. [facility] and the forms were completed by Dr Alvy BimlerSagardia and scanned into the chart. Patient requested copies of the forms because she needed to complete a section on them. Copies were given.

## 2017-03-28 NOTE — Progress Notes (Signed)
Traci Grant 57 y.o.   Chief Complaint  Patient presents with  . Annual Exam    HISTORY OF PRESENT ILLNESS: This is a 57 y.o. female Here for annual exam; no complaints and no medical concerns.   HPI   Prior to Admission medications   Medication Sig Start Date End Date Taking? Authorizing Provider  acetaminophen (TYLENOL) 500 MG tablet Take 500 mg by mouth every 6 (six) hours as needed.   Yes [provider]  ibuprofen (ADVIL,MOTRIN) 200 MG tablet Take 200 mg by mouth every 6 (six) hours as needed.   Yes [provider]    Allergies  Allergen Reactions  . Lactose Intolerance (Gi) Diarrhea    Patient Active Problem List   Diagnosis Date Noted  . Puncture wound of left foot 01/29/2017  . Need for Tdap vaccination 01/29/2017    No past medical history on file.  No past surgical history on file.  Social History   Socioeconomic History  . Marital status: Married    Spouse name: Not on file  . Number of children: Not on file  . Years of education: Not on file  . Highest education level: Not on file  Social Needs  . Financial resource strain: Not on file  . Food insecurity - worry: Not on file  . Food insecurity - inability: Not on file  . Transportation needs - medical: Not on file  . Transportation needs - non-medical: Not on file  Occupational History  . Not on file  Tobacco Use  . Smoking status: Never Smoker  . Smokeless tobacco: Never Used  Substance and Sexual Activity  . Alcohol use: No  . Drug use: No  . Sexual activity: Not on file  Other Topics Concern  . Not on file  Social History Narrative  . Not on file    Family History  Problem Relation Age of Onset  . Hyperlipidemia Father   . Hypertension Father   . Cancer Sister      Review of Systems  Constitutional: Negative.   HENT: Negative.   Eyes: Negative.   Respiratory: Negative.   Cardiovascular: Negative.   Gastrointestinal: Negative.  Negative for abdominal  pain, diarrhea, nausea and vomiting.  Genitourinary: Negative.   Musculoskeletal: Negative.   Skin: Negative.   Neurological: Negative.   Endo/Heme/Allergies: Negative.   All other systems reviewed and are negative.    Physical Exam  Constitutional: She is oriented to person, place, and time. She appears well-developed and well-nourished.  HENT:  Head: Normocephalic and atraumatic.  Nose: Nose normal.  Mouth/Throat: Oropharynx is clear and moist.  Eyes: Conjunctivae and EOM are normal. Pupils are equal, round, and reactive to light.  Neck: Normal range of motion. Neck supple. No JVD present. No thyromegaly present.  Cardiovascular: Normal rate, regular rhythm and normal heart sounds.  Pulmonary/Chest: Effort normal and breath sounds normal.  Abdominal: Soft. Bowel sounds are normal. She exhibits no distension. There is no tenderness. Hernia confirmed negative in the right inguinal area and confirmed negative in the left inguinal area.  Genitourinary: Vagina normal. There is no rash, tenderness or lesion on the right labia. There is no rash, tenderness or lesion on the left labia. Cervix exhibits no motion tenderness, no discharge and no friability. No erythema, tenderness or bleeding in the vagina. No vaginal discharge found.  Musculoskeletal: Normal range of motion. She exhibits no edema or tenderness.  Lymphadenopathy:    She has no cervical adenopathy. No inguinal adenopathy noted  on the right or left side.  Neurological: She is alert and oriented to person, place, and time. No sensory deficit. She exhibits normal muscle tone.  Skin: Skin is warm and dry. Capillary refill takes less than 2 seconds. No rash noted.  Psychiatric: She has a normal mood and affect. Her behavior is normal.  Vitals reviewed.    ASSESSMENT & PLAN: Ashlley was seen today for annual exam.  Diagnoses and all orders for this visit:  Routine general medical examination at a health care facility -     CBC  with Differential -     Comprehensive metabolic panel -     Hemoglobin A1c -     Lipid panel -     TSH -     Hepatitis C antibody screen -     HIV antibody -     Pap IG and Chlamydia/Gonococcus, NAA   Patient Instructions       IF you received an x-ray today, you will receive an invoice from [facility]. Please contact [facility] at [phone] with questions or concerns regarding your invoice.   IF you received labwork today, you will receive an invoice from [facility]. Please contact [facility] at [phone] with questions or concerns regarding your invoice.   Our billing staff will not be able to assist you with questions regarding bills from these companies.  You will be contacted with the lab results as soon as they are available. The fastest way to get your results is to activate your My Chart account. Instructions are located on the last page of this paperwork. If you have not heard from us regarding the results in 2 weeks, please contact this office.     Health Maintenance, Female Adopting a healthy lifestyle and getting preventive care can go a long way to promote health and wellness. Talk with your health care provider about what schedule of regular examinations is right for you. This is a good chance for you to check in with your provider about disease prevention and staying healthy. In between checkups, there are plenty of things you can do on your own. Experts have done a lot of research about which lifestyle changes and preventive measures are most likely to keep you healthy. Ask your health care provider for more information. Weight and diet Eat a healthy diet  Be sure to include plenty of vegetables, fruits, low-fat dairy products, and lean protein.  Do not eat a lot of foods high in solid fats, added sugars, or salt.  Get regular exercise. This is one of the most important things you can do for your health. ? Most adults should exercise for at  least 150 minutes each week. The exercise should increase your heart rate and make you sweat (moderate-intensity exercise). ? Most adults should also do strengthening exercises at least twice a week. This is in addition to the moderate-intensity exercise.  Maintain a healthy weight  Body mass index (BMI) is a measurement that can be used to identify possible weight problems. It estimates body fat based on height and weight. Your health care provider can help determine your BMI and help you achieve or maintain a healthy weight.  For females 20 years of age and older: ? A BMI below 18.5 is considered underweight. ? A BMI of 18.5 to 24.9 is normal. ? A BMI of 25 to 29.9 is considered overweight. ? A BMI of 30 and above is considered obese.  Watch levels of cholesterol and blood lipids    You should start having your blood tested for lipids and cholesterol at 56 years of age, then have this test every 5 years.  You may need to have your cholesterol levels checked more often if: ? Your lipid or cholesterol levels are high. ? You are older than 56 years of age. ? You are at high risk for heart disease.  Cancer screening Lung Cancer  Lung cancer screening is recommended for adults 24-88 years old who are at high risk for lung cancer because of a history of smoking.  A yearly low-dose CT scan of the lungs is recommended for people who: ? Currently smoke. ? Have quit within the past 15 years. ? Have at least a 30-pack-year history of smoking. A pack year is smoking an average of one pack of cigarettes a day for 1 year.  Yearly screening should continue until it has been 15 years since you quit.  Yearly screening should stop if you develop a health problem that would prevent you from having lung cancer treatment.  Breast Cancer  Practice breast self-awareness. This means understanding how your breasts normally appear and feel.  It also means doing regular breast self-exams. Let your  health care provider know about any changes, no matter how small.  If you are in your 20s or 30s, you should have a clinical breast exam (CBE) by a health care provider every 1-3 years as part of a regular health exam.  If you are 80 or older, have a CBE every year. Also consider having a breast X-ray (mammogram) every year.  If you have a family history of breast cancer, talk to your health care provider about genetic screening.  If you are at high risk for breast cancer, talk to your health care provider about having an MRI and a mammogram every year.  Breast cancer gene (BRCA) assessment is recommended for women who have family members with BRCA-related cancers. BRCA-related cancers include: ? Breast. ? Ovarian. ? Tubal. ? Peritoneal cancers.  Results of the assessment will determine the need for genetic counseling and BRCA1 and BRCA2 testing.  Cervical Cancer Your health care provider may recommend that you be screened regularly for cancer of the pelvic organs (ovaries, uterus, and vagina). This screening involves a pelvic examination, including checking for microscopic changes to the surface of your cervix (Pap test). You may be encouraged to have this screening done every 3 years, beginning at age 14.  For women ages 55-65, health care providers may recommend pelvic exams and Pap testing every 3 years, or they may recommend the Pap and pelvic exam, combined with testing for human papilloma virus (HPV), every 5 years. Some types of HPV increase your risk of cervical cancer. Testing for HPV may also be done on women of any age with unclear Pap test results.  Other health care providers may not recommend any screening for nonpregnant women who are considered low risk for pelvic cancer and who do not have symptoms. Ask your health care provider if a screening pelvic exam is right for you.  If you have had past treatment for cervical cancer or a condition that could lead to cancer, you need  Pap tests and screening for cancer for at least 20 years after your treatment. If Pap tests have been discontinued, your risk factors (such as having a new sexual partner) need to be reassessed to determine if screening should resume. Some women have medical problems that increase the chance of getting cervical cancer. In these  cases, your health care provider may recommend more frequent screening and Pap tests.  Colorectal Cancer  This type of cancer can be detected and often prevented.  Routine colorectal cancer screening usually begins at 56 years of age and continues through 56 years of age.  Your health care provider may recommend screening at an earlier age if you have risk factors for colon cancer.  Your health care provider may also recommend using home test kits to check for hidden blood in the stool.  A small camera at the end of a tube can be used to examine your colon directly (sigmoidoscopy or colonoscopy). This is done to check for the earliest forms of colorectal cancer.  Routine screening usually begins at age 50.  Direct examination of the colon should be repeated every 5-10 years through 56 years of age. However, you may need to be screened more often if early forms of precancerous polyps or small growths are found.  Skin Cancer  Check your skin from head to toe regularly.  Tell your health care provider about any new moles or changes in moles, especially if there is a change in a mole's shape or color.  Also tell your health care provider if you have a mole that is larger than the size of a pencil eraser.  Always use sunscreen. Apply sunscreen liberally and repeatedly throughout the day.  Protect yourself by wearing long sleeves, pants, a wide-brimmed hat, and sunglasses whenever you are outside.  Heart disease, diabetes, and high blood pressure  High blood pressure causes heart disease and increases the risk of stroke. High blood pressure is more likely to develop  in: ? People who have blood pressure in the high end of the normal range (130-139/85-89 mm Hg). ? People who are overweight or obese. ? People who are African American.  If you are 18-39 years of age, have your blood pressure checked every 3-5 years. If you are 40 years of age or older, have your blood pressure checked every year. You should have your blood pressure measured twice-once when you are at a hospital or clinic, and once when you are not at a hospital or clinic. Record the average of the two measurements. To check your blood pressure when you are not at a hospital or clinic, you can use: ? An automated blood pressure machine at a pharmacy. ? A home blood pressure monitor.  If you are between 55 years and 79 years old, ask your health care provider if you should take aspirin to prevent strokes.  Have regular diabetes screenings. This involves taking a blood sample to check your fasting blood sugar level. ? If you are at a normal weight and have a low risk for diabetes, have this test once every three years after 56 years of age. ? If you are overweight and have a high risk for diabetes, consider being tested at a younger age or more often. Preventing infection Hepatitis B  If you have a higher risk for hepatitis B, you should be screened for this virus. You are considered at high risk for hepatitis B if: ? You were born in a country where hepatitis B is common. Ask your health care provider which countries are considered high risk. ? Your parents were born in a high-risk country, and you have not been immunized against hepatitis B (hepatitis B vaccine). ? You have HIV or AIDS. ? You use needles to inject street drugs. ? You live with someone who has hepatitis   B. ? You have had sex with someone who has hepatitis B. ? You get hemodialysis treatment. ? You take certain medicines for conditions, including cancer, organ transplantation, and autoimmune conditions.  Hepatitis C  Blood  testing is recommended for: ? Everyone born from [birth year] through 1964. ? Anyone with known risk factors for hepatitis C.  Sexually transmitted infections (STIs)  You should be screened for sexually transmitted infections (STIs) including gonorrhea and chlamydia if: ? You are sexually active and are younger than 56 years of age. ? You are older than 56 years of age and your health care provider tells you that you are at risk for this type of infection. ? Your sexual activity has changed since you were last screened and you are at an increased risk for chlamydia or gonorrhea. Ask your health care provider if you are at risk.  If you do not have HIV, but are at risk, it may be recommended that you take a prescription medicine daily to prevent HIV infection. This is called pre-exposure prophylaxis (PrEP). You are considered at risk if: ? You are sexually active and do not regularly use condoms or know the HIV status of your partner(s). ? You take drugs by injection. ? You are sexually active with a partner who has HIV.  Talk with your health care provider about whether you are at high risk of being infected with HIV. If you choose to begin PrEP, you should first be tested for HIV. You should then be tested every 3 months for as long as you are taking PrEP. Pregnancy  If you are premenopausal and you may become pregnant, ask your health care provider about preconception counseling.  If you may become pregnant, take 400 to 800 micrograms (mcg) of folic acid every day.  If you want to prevent pregnancy, talk to your health care provider about birth control (contraception). Osteoporosis and menopause  Osteoporosis is a disease in which the bones lose minerals and strength with aging. This can result in serious bone fractures. Your risk for osteoporosis can be identified using a bone density scan.  If you are 54 years of age or older, or if you are at risk for osteoporosis and fractures, ask your  health care provider if you should be screened.  Ask your health care provider whether you should take a calcium or vitamin D supplement to lower your risk for osteoporosis.  Menopause may have certain physical symptoms and risks.  Hormone replacement therapy may reduce some of these symptoms and risks. Talk to your health care provider about whether hormone replacement therapy is right for you. Follow these instructions at home:  Schedule regular health, dental, and eye exams.  Stay current with your immunizations.  Do not use any tobacco products including cigarettes, chewing tobacco, or electronic cigarettes.  If you are pregnant, do not drink alcohol.  If you are breastfeeding, limit how much and how often you drink alcohol.  Limit alcohol intake to no more than 1 drink per day for nonpregnant women. One drink equals 12 ounces of beer, 5 ounces of wine, or 1 ounces of hard liquor.  Do not use street drugs.  Do not share needles.  Ask your health care provider for help if you need support or information about quitting drugs.  Tell your health care provider if you often feel depressed.  Tell your health care provider if you have ever been abused or do not feel safe at home. This information is not intended  to replace advice given to you by your health care provider. Make sure you discuss any questions you have with your health care provider. Document Released: 09/17/2010 Document Revised: 08/10/2015 Document Reviewed: 12/06/2014 Elsevier Interactive Patient Education  2018 [place] (AHA) Exercise Recommendation  Being physically active is important to prevent heart disease and stroke, the nation's No. 1and No. 5killers. To improve overall cardiovascular health, we suggest at least 150 minutes per week of moderate exercise or 75 minutes per week of vigorous exercise (or a combination of moderate and vigorous activity). Thirty minutes a day,  five times a week is an easy goal to remember. You will also experience benefits even if you divide your time into two or three segments of 10 to 15 minutes per day.  For people who would benefit from lowering their blood pressure or cholesterol, we recommend 40 minutes of aerobic exercise of moderate to vigorous intensity three to four times a week to lower the risk for heart attack and stroke.  Physical activity is anything that makes you move your body and burn calories.  This includes things like climbing stairs or playing sports. Aerobic exercises benefit your heart, and include walking, jogging, swimming or biking. Strength and stretching exercises are best for overall stamina and flexibility.  The simplest, positive change you can make to effectively improve your heart health is to start walking. It's enjoyable, free, easy, social and great exercise. A walking program is flexible and boasts high success rates because people can stick with it. It's easy for walking to become a regular and satisfying part of life.   For Overall Cardiovascular Health:  At least 30 minutes of moderate-intensity aerobic activity at least 5 days per week for a total of 150  OR   At least 25 minutes of vigorous aerobic activity at least 3 days per week for a total of 75 minutes; or a combination of moderate- and vigorous-intensity aerobic activity  AND   Moderate- to high-intensity muscle-strengthening activity at least 2 days per week for additional health benefits.  For Lowering Blood Pressure and Cholesterol  An average 40 minutes of moderate- to vigorous-intensity aerobic activity 3 or 4 times per week  What if I can't make it to the time goal? Something is always better than nothing! And everyone has to start somewhere. Even if you've been sedentary for years, today is the day you can begin to make healthy changes in your life. If you don't think you'll make it for 30 or 40 minutes, set a reachable  goal for today. You can work up toward your overall goal by increasing your time as you get stronger. Don't let all-or-nothing thinking rob you of doing what you can every day.  Source:[email or web address].[email or web address] Pop, MD [facility]

## 2017-03-29 LAB — CBC WITH DIFFERENTIAL/PLATELET
Basophils Absolute: 0 10*3/uL (ref 0.0–0.2)
Basos: 0 %
EOS (ABSOLUTE): 0.1 10*3/uL (ref 0.0–0.4)
Eos: 1 %
Hematocrit: 44.4 % (ref 34.0–46.6)
Hemoglobin: 14.2 g/dL (ref 11.1–15.9)
IMMATURE GRANS (ABS): 0 10*3/uL (ref 0.0–0.1)
Immature Granulocytes: 0 %
Lymphocytes Absolute: 2.3 10*3/uL (ref 0.7–3.1)
Lymphs: 55 %
MCH: 27.4 pg (ref 26.6–33.0)
MCHC: 32 g/dL (ref 31.5–35.7)
MCV: 86 fL (ref 79–97)
Monocytes Absolute: 0.2 10*3/uL (ref 0.1–0.9)
Monocytes: 6 %
Neutrophils Absolute: 1.6 10*3/uL (ref 1.4–7.0)
Neutrophils: 38 %
Platelets: 229 10*3/uL (ref 150–379)
RBC: 5.18 x10E6/uL (ref 3.77–5.28)
RDW: 14.5 % (ref 12.3–15.4)
WBC: 4.1 10*3/uL (ref 3.4–10.8)

## 2017-03-29 LAB — COMPREHENSIVE METABOLIC PANEL
A/G RATIO: 1.3 (ref 1.2–2.2)
ALBUMIN: 4.2 g/dL (ref 3.5–5.5)
ALT: 11 IU/L (ref 0–32)
AST: 22 IU/L (ref 0–40)
Alkaline Phosphatase: 113 IU/L (ref 39–117)
BUN / CREAT RATIO: 9 (ref 9–23)
BUN: 8 mg/dL (ref 6–24)
Bilirubin Total: 0.6 mg/dL (ref 0.0–1.2)
CALCIUM: 9.3 mg/dL (ref 8.7–10.2)
CO2: 25 mmol/L (ref 20–29)
Chloride: 107 mmol/L — ABNORMAL HIGH (ref 96–106)
Creatinine, Ser: 0.93 mg/dL (ref 0.57–1.00)
GFR, EST AFRICAN AMERICAN: 80 mL/min/{1.73_m2} (ref >59)
GFR, EST NON AFRICAN AMERICAN: 69 mL/min/{1.73_m2} (ref >59)
GLOBULIN, TOTAL: 3.2 g/dL (ref 1.5–4.5)
Glucose: 94 mg/dL (ref 65–99)
POTASSIUM: 3.9 mmol/L (ref 3.5–5.2)
SODIUM: 145 mmol/L — AB (ref 134–144)
Total Protein: 7.4 g/dL (ref 6.0–8.5)

## 2017-03-29 LAB — LIPID PANEL
CHOL/HDL RATIO: 5.3 ratio — AB (ref 0.0–4.4)
Cholesterol, Total: 184 mg/dL (ref 100–199)
HDL: 35 mg/dL — ABNORMAL LOW (ref >39)
LDL CALC: 136 mg/dL — AB (ref 0–99)
Triglycerides: 63 mg/dL (ref 0–149)
VLDL CHOLESTEROL CAL: 13 mg/dL (ref 5–40)

## 2017-03-29 LAB — HEMOGLOBIN A1C
ESTIMATED AVERAGE GLUCOSE: 117 mg/dL
Hgb A1c MFr Bld: 5.7 % — ABNORMAL HIGH (ref 4.8–5.6)

## 2017-03-29 LAB — HIV ANTIBODY (ROUTINE TESTING W REFLEX): HIV Screen 4th Generation wRfx: NONREACTIVE

## 2017-03-29 LAB — HEPATITIS C ANTIBODY: Hep C Virus Ab: 0.1 s/co ratio (ref 0.0–0.9)

## 2017-03-29 LAB — TSH: TSH: 1.08 u[IU]/mL (ref 0.450–4.500)

## 2017-04-01 ENCOUNTER — Other Ambulatory Visit: Payer: Self-pay | Admitting: Emergency Medicine

## 2017-04-01 ENCOUNTER — Encounter: Payer: Self-pay | Admitting: Radiology

## 2017-04-01 DIAGNOSIS — R87619 Unspecified abnormal cytological findings in specimens from cervix uteri: Secondary | ICD-10-CM

## 2017-04-01 LAB — PAP IG AND CT-NG NAA
Chlamydia, Nuc. Acid Amp: NEGATIVE
Gonococcus by Nucleic Acid Amp: NEGATIVE
PAP SMEAR COMMENT: 0

## 2017-04-14 DIAGNOSIS — Z0271 Encounter for disability determination: Secondary | ICD-10-CM

## 2017-04-25 ENCOUNTER — Other Ambulatory Visit: Payer: Self-pay

## 2017-04-25 ENCOUNTER — Ambulatory Visit: Payer: BC Managed Care – PPO | Admitting: Obstetrics and Gynecology

## 2017-04-25 ENCOUNTER — Other Ambulatory Visit (HOSPITAL_COMMUNITY)
Admission: RE | Admit: 2017-04-25 | Discharge: 2017-04-25 | Disposition: A | Discharge status: Home / Self Care | Payer: BC Managed Care – PPO | Source: Ambulatory Visit | Attending: Obstetrics and Gynecology | Admitting: Obstetrics and Gynecology

## 2017-04-25 ENCOUNTER — Encounter: Payer: Self-pay | Admitting: Obstetrics and Gynecology

## 2017-04-25 VITALS — BP 110/70 | HR 84 | Resp 16 | Ht 65.5 in | Wt 180.0 lb

## 2017-04-25 DIAGNOSIS — R87610 Atypical squamous cells of undetermined significance on cytologic smear of cervix (ASC-US): Secondary | ICD-10-CM | POA: Insufficient documentation

## 2017-04-25 NOTE — Addendum Note (Signed)
Addended by: Shelda JakesHANNER, MARTHA E on: 04/25/2017 10:26 AM   Modules accepted: Orders

## 2017-04-25 NOTE — Progress Notes (Signed)
57 y.o. [id number] MarriedAfrican AmericanF here for a consultation by Dr Alvy BimlerSagardia for abnormal PAP.  The patient's recent pap returned with ASCUS.  She hasn't been sexually active for the last 2-3 years.     No LMP recorded. Patient is postmenopausal.          Sexually active: No.  The current method of family planning is tubal ligation.    Exercising: No.  The patient does not participate in regular exercise at present. Smoker:  no  Health Maintenance: Pap:  03-28-17 ASCUS  History of abnormal Pap:  Yes, history of abnormal pap in the past, f/u was fine.  MMG:  Years ago Colonoscopy:  Never BMD:   Never TDaP:  01-29-17 Gardasil: N/A   reports that  has never smoked. she has never used smokeless tobacco. She reports that she does not drink alcohol or use drugs. She has 2 grown children, 2 grown step-children and a 56 year old daughter (with her husband)  History reviewed. No pertinent past medical history.  Past Surgical History:  Procedure Laterality Date  . TUBAL LIGATION      No current outpatient medications on file.   No current facility-administered medications for this visit.     Family History  Problem Relation Age of Onset  . Hyperlipidemia Father   . Hypertension Father   . Glaucoma Father   . Cancer Sister   . Leukemia Sister   . Leukemia Paternal Uncle     Review of Systems  Constitutional: Negative.   HENT: Negative.   Eyes: Negative.   Respiratory: Negative.   Cardiovascular: Negative.   Gastrointestinal: Negative.   Endocrine: Negative.   Genitourinary: Negative.   Musculoskeletal: Negative.   Skin: Negative.   Allergic/Immunologic: Negative.   Neurological: Negative.   Psychiatric/Behavioral: Negative.     Exam:   BP 110/70 (BP Location: Right Arm, Patient Position: Sitting, Cuff Size: Normal)   Pulse 84   Resp 16   Ht 5' 5.5" (1.664 m)   Wt 180 lb (81.6 kg)   BMI 29.50 kg/m   Weight change: @WEIGHTCHANGE @ Height:   Height: 5' 5.5" (166.4  cm)  Ht Readings from Last 3 Encounters:  04/25/17 5' 5.5" (1.664 m)  03/28/17 5' 5.75" (1.67 m)  01/29/17 5' 5.5" (1.664 m)    General appearance: alert, cooperative and appears stated age   Pelvic: External genitalia:  no lesions              Urethra:  normal appearing urethra with no masses, tenderness or lesions              Bartholins and Skenes: normal                 Vagina: normal appearing atrophic vagina with normal color and discharge, no lesions              Cervix:no lesions  Chaperone was present for exam.  A:  H/O ASCUS pap  P:   Discussed ASCUS pap results with the patient  Will do hpv testing, depending on these results will make further recommendations  If the HPV testing is negative, she needs a f/u pap with hpv in 3 years if + she will need a colposcopy  CC: Dr Alvy BimlerSagardia Note sent

## 2017-04-29 LAB — CYTOLOGY - PAP: HPV: NOT DETECTED

## 2017-06-13 ENCOUNTER — Ambulatory Visit: Payer: BC Managed Care – PPO | Admitting: Emergency Medicine

## 2017-06-13 ENCOUNTER — Telehealth: Payer: Self-pay | Admitting: Emergency Medicine

## 2017-06-13 ENCOUNTER — Other Ambulatory Visit: Payer: Self-pay

## 2017-06-13 ENCOUNTER — Encounter: Payer: Self-pay | Admitting: Emergency Medicine

## 2017-06-13 VITALS — BP 118/70 | HR 71 | Temp 98.0°F | Resp 16 | Wt 179.4 lb

## 2017-06-13 DIAGNOSIS — Z029 Encounter for administrative examinations, unspecified: Secondary | ICD-10-CM

## 2017-06-13 NOTE — Progress Notes (Signed)
Traci Grant 57 y.o.   Chief Complaint  Patient presents with  . FMLA    paper work to be completed-patient taking care of mother    HISTORY OF PRESENT ILLNESS: This is a 57 y.o. female here to go over FMLA papers.  Requesting leave to attend for mother's recent surgery and rehab period.  Mother just had total knee replacement and daughter is the sole caretaker.  HPI   Prior to Admission medications   Not on File    Allergies  Allergen Reactions  . Lactose Intolerance (Gi) Diarrhea    Patient Active Problem List   Diagnosis Date Noted  . Puncture wound of left foot 01/29/2017  . Need for Tdap vaccination 01/29/2017    No past medical history on file.  Past Surgical History:  Procedure Laterality Date  . TUBAL LIGATION      Social History   Socioeconomic History  . Marital status: Married    Spouse name: Not on file  . Number of children: Not on file  . Years of education: Not on file  . Highest education level: Not on file  Occupational History  . Not on file  Social Needs  . Financial resource strain: Not on file  . Food insecurity:    Worry: Not on file    Inability: Not on file  . Transportation needs:    Medical: Not on file    Non-medical: Not on file  Tobacco Use  . Smoking status: Never Smoker  . Smokeless tobacco: Never Used  Substance and Sexual Activity  . Alcohol use: No  . Drug use: No  . Sexual activity: Never    Partners: Male    Birth control/protection: Surgical  Lifestyle  . Physical activity:    Days per week: Not on file    Minutes per session: Not on file  . Stress: Not on file  Relationships  . Social connections:    Talks on phone: Not on file    Gets together: Not on file    Attends religious service: Not on file    Active member of club or organization: Not on file    Attends meetings of clubs or organizations: Not on file    Relationship status: Not on file  . Intimate partner violence:    Fear of current or ex  partner: Not on file    Emotionally abused: Not on file    Physically abused: Not on file    Forced sexual activity: Not on file  Other Topics Concern  . Not on file  Social History Narrative  . Not on file    Family History  Problem Relation Age of Onset  . Hyperlipidemia Father   . Hypertension Father   . Glaucoma Father   . Cancer Sister   . Leukemia Sister   . Leukemia Paternal Uncle      ROS  Vitals:   06/13/17 1413  BP: 118/70  Pulse: 71  Resp: 16  Temp: 98 F (36.7 C)  SpO2: 100%    Physical Exam  Constitutional: She is oriented to person, place, and time. She appears well-developed and well-nourished.  HENT:  Head: Normocephalic.  Neck: Normal range of motion.  Pulmonary/Chest: Effort normal.  Neurological: She is alert and oriented to person, place, and time.  Psychiatric: She has a normal mood and affect. Her behavior is normal.  Vitals reviewed.    ASSESSMENT & PLAN: Traci Grant was seen today for fmla.  Diagnoses and all  orders for this visit:  Administrative encounter   FMLA lengthy form filled out for patient.   Edwina Barth, MD [facility]

## 2017-06-13 NOTE — Telephone Encounter (Signed)
Copied from CRM [id number]. Topic: Quick Communication - See Telephone Encounter >> Jun 13, 2017  3:20 PM Cipriano BunkerLambe, Annette S wrote: CRM for notification.   Pt. Called and wants doctor to call her back  about visit today  See Telephone encounter for: 06/13/17.

## 2017-06-13 NOTE — Patient Instructions (Signed)
     IF you received an x-ray today, you will receive an invoice from [facility]. Please contact [facility] at [phone] with questions or concerns regarding your invoice.   IF you received labwork today, you will receive an invoice from [facility]. Please contact [facility] at [phone] with questions or concerns regarding your invoice.   Our billing staff will not be able to assist you with questions regarding bills from these companies.  You will be contacted with the lab results as soon as they are available. The fastest way to get your results is to activate your My Chart account. Instructions are located on the last page of this paperwork. If you have not heard from us regarding the results in 2 weeks, please contact this office.     

## 2017-06-16 NOTE — Telephone Encounter (Signed)
Pt states she dropped off FMLA forms this morning and pt thought you were calling to ask a question about the forms or let her know they are ready for pick up.   Pt was unaware of the 5-7 day turnaround.  Pt hoping to get before then, but aware it may take this long

## 2017-06-16 NOTE — Telephone Encounter (Signed)
Just an FYI that pt dropped off paperwork and was hoping to get them back asap.

## 2017-06-16 NOTE — Telephone Encounter (Signed)
Left message for patient to call back  Can we get some more information about what patient would like to discuss so can forward it to doctor.

## 2017-06-17 NOTE — Telephone Encounter (Signed)
They were in my box. I will complete them as soon as I can but it will take some time. Thanks.

## 2017-06-17 NOTE — Telephone Encounter (Signed)
Paperwork was updated to the best of the providers ability and faxed to the pt's employer on 06/17/17.

## 2017-06-17 NOTE — Telephone Encounter (Signed)
I have not gotten any FMLA forms for this patient I will ask the provider if he has them--if so I will need to get them before they are given back to the pt due to our processing fee.  Dr Alvy BimlerSagardia have you seen any FMLA forms for this patient if so please place them in my box at checkout desk because I have not seen them or processed them.  Thank you

## 2017-06-18 ENCOUNTER — Telehealth: Payer: Self-pay | Admitting: Emergency Medicine

## 2017-06-18 NOTE — Telephone Encounter (Signed)
Copied from CRM [id number]. Topic: Quick Communication - See Telephone Encounter >> Jun 18, 2017  8:50 AM Louie BunPalacios Medina, Rosey Batheresa D wrote: CRM for notification. See Telephone encounter for: 06/18/17. Patient would like copies of her FMLA paperwork. She will go today and pick them up, thanks.

## 2017-07-10 DIAGNOSIS — Z0271 Encounter for disability determination: Secondary | ICD-10-CM

## 2018-11-24 ENCOUNTER — Ambulatory Visit: Payer: BC Managed Care – PPO | Admitting: Emergency Medicine

## 2018-11-26 ENCOUNTER — Ambulatory Visit: Payer: BC Managed Care – PPO | Admitting: Emergency Medicine

## 2018-11-26 ENCOUNTER — Encounter: Payer: Self-pay | Admitting: Emergency Medicine

## 2018-11-26 ENCOUNTER — Other Ambulatory Visit: Payer: Self-pay

## 2018-11-26 VITALS — BP 131/80 | HR 70 | Temp 98.8°F | Resp 16 | Ht 64.0 in | Wt 190.2 lb

## 2018-11-26 DIAGNOSIS — M7551 Bursitis of right shoulder: Secondary | ICD-10-CM | POA: Diagnosis not present

## 2018-11-26 DIAGNOSIS — M7751 Other enthesopathy of right foot: Secondary | ICD-10-CM | POA: Diagnosis not present

## 2018-11-26 MED ORDER — CYCLOBENZAPRINE HCL 10 MG PO TABS: 10.0000 mg | ORAL_TABLET | Freq: Every day | ORAL | 0 refills | Status: DC

## 2018-11-26 MED ORDER — PREDNISONE 20 MG PO TABS: 40.0000 mg | ORAL_TABLET | Freq: Every day | ORAL | 0 refills | Status: AC

## 2018-11-26 NOTE — Patient Instructions (Addendum)
   If you have lab work done today you will be contacted with your lab results within the next 2 weeks.  If you have not heard from us then please contact us. The fastest way to get your results is to register for My Chart.   IF you received an x-ray today, you will receive an invoice from [facility]. Please contact [facility] at [phone] with questions or concerns regarding your invoice.   IF you received labwork today, you will receive an invoice from [facility]. Please contact [facility] at [phone] with questions or concerns regarding your invoice.   Our billing staff will not be able to assist you with questions regarding bills from these companies.  You will be contacted with the lab results as soon as they are available. The fastest way to get your results is to activate your My Chart account. Instructions are located on the last page of this paperwork. If you have not heard from us regarding the results in 2 weeks, please contact this office.     Shoulder Pain Many things can cause shoulder pain, including:  An injury.  Moving the shoulder in the same way again and again (overuse).  Joint pain (arthritis). Pain can come from:  Swelling and irritation (inflammation) of any part of the shoulder.  An injury to the shoulder joint.  An injury to: ? Tissues that connect muscle to bone (tendons). ? Tissues that connect bones to each other (ligaments). ? Bones. Follow these instructions at home: Watch for changes in your symptoms. Let your doctor know about them. Follow these instructions to help with your pain. If you have a sling:  Wear the sling as told by your doctor. Remove it only as told by your doctor.  Loosen the sling if your fingers: ? Tingle. ? Become numb. ? Turn cold and blue.  Keep the sling clean.  If the sling is not waterproof: ? Do not let it get wet. ? Take the sling off when you shower or bathe. Managing pain, stiffness,  and swelling   If told, put ice on the painful area: ? Put ice in a plastic bag. ? Place a towel between your skin and the bag. ? Leave the ice on for 20 minutes, 2-3 times a day. Stop putting ice on if it does not help with the pain.  Squeeze a soft ball or a foam pad as much as possible. This prevents swelling in the shoulder. It also helps to strengthen the arm. General instructions  Take over-the-counter and prescription medicines only as told by your doctor.  Keep all follow-up visits as told by your doctor. This is important. Contact a doctor if:  Your pain gets worse.  Medicine does not help your pain.  You have new pain in your arm, hand, or fingers. Get help right away if:  Your arm, hand, or fingers: ? Tingle. ? Are numb. ? Are swollen. ? Are painful. ? Turn white or blue. Summary  Shoulder pain can be caused by many things. These include injury, moving the shoulder in the same away again and again, and joint pain.  Watch for changes in your symptoms. Let your doctor know about them.  This condition may be treated with a sling, ice, and pain medicine.  Contact your doctor if the pain gets worse or you have new pain. Get help right away if your arm, hand, or fingers tingle or get numb, swollen, or painful.  Keep all follow-up visits   as told by your doctor. This is important. This information is not intended to replace advice given to you by your health care provider. Make sure you discuss any questions you have with your health care provider. Document Released: 08/21/2007 Document Revised: 09/16/2017 Document Reviewed: 09/16/2017 Elsevier Patient Education  2020 Elsevier Inc.  

## 2018-11-26 NOTE — Progress Notes (Signed)
Traci Grant 58 y.o.   Chief Complaint  Patient presents with  . Shoulder Pain    RIGHT x 2 weeks  . Foot Pain    right foot x 2 weeks    HISTORY OF PRESENT ILLNESS: This is a 58 y.o. female complaining of pain to her right shoulder and right foot for the past 2 weeks.  Denies injury.  Denies associated symptoms.  Sharp constant nonradiating pain worse with movement and better with rest.  HPI   Prior to Admission medications   Not on File    Allergies  Allergen Reactions  . Lactose Intolerance (Gi) Diarrhea    There are no active problems to display for this patient.   History reviewed. No pertinent past medical history.  Past Surgical History:  Procedure Laterality Date  . TUBAL LIGATION      Social History   Socioeconomic History  . Marital status: Married    Spouse name: Not on file  . Number of children: Not on file  . Years of education: Not on file  . Highest education level: Not on file  Occupational History  . Not on file  Social Needs  . Financial resource strain: Not on file  . Food insecurity    Worry: Not on file    Inability: Not on file  . Transportation needs    Medical: Not on file    Non-medical: Not on file  Tobacco Use  . Smoking status: Never Smoker  . Smokeless tobacco: Never Used  Substance and Sexual Activity  . Alcohol use: No  . Drug use: No  . Sexual activity: Never    Partners: Male    Birth control/protection: Surgical  Lifestyle  . Physical activity    Days per week: Not on file    Minutes per session: Not on file  . Stress: Not on file  Relationships  . Social Herbalist on phone: Not on file    Gets together: Not on file    Attends religious service: Not on file    Active member of club or organization: Not on file    Attends meetings of clubs or organizations: Not on file    Relationship status: Not on file  . Intimate partner violence    Fear of current or ex partner: Not on file   Emotionally abused: Not on file    Physically abused: Not on file    Forced sexual activity: Not on file  Other Topics Concern  . Not on file  Social History Narrative  . Not on file    Family History  Problem Relation Age of Onset  . Hyperlipidemia Father   . Hypertension Father   . Glaucoma Father   . Cancer Sister   . Leukemia Sister   . Leukemia Paternal Uncle      Review of Systems  Constitutional: Negative.  Negative for chills and fever.  HENT: Negative for congestion and sore throat.   Respiratory: Negative.  Negative for cough and shortness of breath.   Cardiovascular: Negative for chest pain and palpitations.  Gastrointestinal: Negative for abdominal pain, diarrhea, nausea and vomiting.  Genitourinary: Negative.   Musculoskeletal: Positive for joint pain (Right shoulder).       Right foot pain  Skin: Negative.  Negative for rash.  Neurological: Negative.  Negative for dizziness and headaches.  All other systems reviewed and are negative.  Vitals:   11/26/18 1640  BP: 131/80  Pulse: 70  Resp: 16  Temp: 98.8 F (37.1 C)  SpO2: 99%     Physical Exam Vitals signs reviewed.  Constitutional:      Appearance: Normal appearance.  HENT:     Head: Normocephalic.  Eyes:     Extraocular Movements: Extraocular movements intact.     Pupils: Pupils are equal, round, and reactive to light.  Neck:     Musculoskeletal: Normal range of motion and neck supple.  Cardiovascular:     Rate and Rhythm: Normal rate and regular rhythm.     Heart sounds: Normal heart sounds.  Pulmonary:     Effort: Pulmonary effort is normal.     Breath sounds: Normal breath sounds.  Musculoskeletal:     Comments: Right shoulder: No tenderness to palpation.  Limited range of motion due to pain. Right upper extremity: Neurovascularly intact.  Normal elbow and normal wrist. Right foot: No erythema or bruising.  Some tenderness to dorsal aspect, otherwise within normal limits and  neurovascularly intact with full range of motion.  Skin:    General: Skin is warm and dry.  Neurological:     General: No focal deficit present.     Mental Status: She is alert and oriented to person, place, and time.  Psychiatric:        Mood and Affect: Mood normal.        Behavior: Behavior normal.      ASSESSMENT & PLAN: Traci Grant was seen today for shoulder pain and foot pain.  Diagnoses and all orders for this visit:  Acute bursitis of right shoulder -     predniSONE (DELTASONE) 20 MG tablet; Take 2 tablets (40 mg total) by mouth daily with breakfast for 5 days. -     cyclobenzaprine (FLEXERIL) 10 MG tablet; Take 1 tablet (10 mg total) by mouth at bedtime.  Tendinitis of right foot    Patient Instructions       If you have lab work done today you will be contacted with your lab results within the next 2 weeks.  If you have not heard from [place] then please contact us. The fastest way to get your results is to register for My Chart.   IF you received an x-ray today, you will receive an invoice from [facility]. Please contact [facility] at [phone] with questions or concerns regarding your invoice.   IF you received labwork today, you will receive an invoice from [facility]. Please contact [facility] at [phone] with questions or concerns regarding your invoice.   Our billing staff will not be able to assist you with questions regarding bills from these companies.  You will be contacted with the lab results as soon as they are available. The fastest way to get your results is to activate your My Chart account. Instructions are located on the last page of this paperwork. If you have not heard from [place] regarding the results in 2 weeks, please contact this office.     Shoulder Pain Many things can cause shoulder pain, including:  An injury.  Moving the shoulder in the same way again and again (overuse).  Joint pain (arthritis). Pain can come from:   Swelling and irritation (inflammation) of any part of the shoulder.  An injury to the shoulder joint.  An injury to: ? Tissues that connect muscle to bone (tendons). ? Tissues that connect bones to each other (ligaments). ? Bones. Follow these instructions at home: Watch for changes in your symptoms. Let your doctor know about them.  Follow these instructions to help with your pain. If you have a sling:  Wear the sling as told by your doctor. Remove it only as told by your doctor.  Loosen the sling if your fingers: ? Tingle. ? Become numb. ? Turn cold and blue.  Keep the sling clean.  If the sling is not waterproof: ? Do not let it get wet. ? Take the sling off when you shower or bathe. Managing pain, stiffness, and swelling   If told, put ice on the painful area: ? Put ice in a plastic bag. ? Place a towel between your skin and the bag. ? Leave the ice on for 20 minutes, 2-3 times a day. Stop putting ice on if it does not help with the pain.  Squeeze a soft ball or a foam pad as much as possible. This prevents swelling in the shoulder. It also helps to strengthen the arm. General instructions  Take over-the-counter and prescription medicines only as told by your doctor.  Keep all follow-up visits as told by your doctor. This is important. Contact a doctor if:  Your pain gets worse.  Medicine does not help your pain.  You have new pain in your arm, hand, or fingers. Get help right away if:  Your arm, hand, or fingers: ? Tingle. ? Are numb. ? Are swollen. ? Are painful. ? Turn white or blue. Summary  Shoulder pain can be caused by many things. These include injury, moving the shoulder in the same away again and again, and joint pain.  Watch for changes in your symptoms. Let your doctor know about them.  This condition may be treated with a sling, ice, and pain medicine.  Contact your doctor if the pain gets worse or you have new pain. Get help right away  if your arm, hand, or fingers tingle or get numb, swollen, or painful.  Keep all follow-up visits as told by your doctor. This is important. This information is not intended to replace advice given to you by your health care provider. Make sure you discuss any questions you have with your health care provider. Document Released: 08/21/2007 Document Revised: 09/16/2017 Document Reviewed: 09/16/2017 Elsevier Patient Education  2020 Elsevier Inc.      Edwina BarthMiguel Elsi Stelzer, MD [facility]

## 2019-02-25 ENCOUNTER — Ambulatory Visit: Payer: BC Managed Care – PPO | Admitting: Emergency Medicine

## 2019-02-25 ENCOUNTER — Encounter: Payer: Self-pay | Admitting: Emergency Medicine

## 2019-02-25 ENCOUNTER — Other Ambulatory Visit: Payer: Self-pay

## 2019-02-25 VITALS — BP 135/78 | HR 69 | Temp 97.8°F | Resp 16 | Ht 64.75 in | Wt 184.0 lb

## 2019-02-25 DIAGNOSIS — J3489 Other specified disorders of nose and nasal sinuses: Secondary | ICD-10-CM

## 2019-02-25 MED ORDER — AMOXICILLIN-POT CLAVULANATE 875-125 MG PO TABS: 1.0000 | ORAL_TABLET | Freq: Two times a day (BID) | ORAL | 0 refills | Status: AC

## 2019-02-25 NOTE — Progress Notes (Signed)
Traci Grant 58 y.o.   Chief Complaint  Patient presents with  . Sinus Problem    per patient went to the dentist yesterday for tooth pain    HISTORY OF PRESENT ILLNESS: This is a 58 y.o. female complaining of left-sided dental pain, may be sinus pain, that started 2 days ago.  Sharp pain to left side of her mouth radiating to maxillary sinus area, left forehead, left ear and teeth.  Saw dentist yesterday who does not think this is dental pain.  Raised the possibility of a sinus infection.  No other significant symptoms.  No trauma.  HPI   Prior to Admission medications   Medication Sig Start Date End Date Taking? Authorizing Provider  cyclobenzaprine (FLEXERIL) 10 MG tablet Take 1 tablet (10 mg total) by mouth at bedtime. Patient not taking: Reported on 02/25/2019 11/26/18   Georgina Quint, MD    Allergies  Allergen Reactions  . Lactose Intolerance (Gi) Diarrhea    There are no problems to display for this patient.   History reviewed. No pertinent past medical history.  Past Surgical History:  Procedure Laterality Date  . TUBAL LIGATION      Social History   Socioeconomic History  . Marital status: Married    Spouse name: Not on file  . Number of children: Not on file  . Years of education: Not on file  . Highest education level: Not on file  Occupational History  . Not on file  Tobacco Use  . Smoking status: Never Smoker  . Smokeless tobacco: Never Used  Substance and Sexual Activity  . Alcohol use: No  . Drug use: No  . Sexual activity: Never    Partners: Male    Birth control/protection: Surgical  Other Topics Concern  . Not on file  Social History Narrative  . Not on file   Social Determinants of Health   Financial Resource Strain:   . Difficulty of Paying Living Expenses: Not on file  Food Insecurity:   . Worried About Programme researcher, broadcasting/film/video in the Last Year: Not on file  . Ran Out of Food in the Last Year: Not on file  Transportation  Needs:   . Lack of Transportation (Medical): Not on file  . Lack of Transportation (Non-Medical): Not on file  Physical Activity:   . Days of Exercise per Week: Not on file  . Minutes of Exercise per Session: Not on file  Stress:   . Feeling of Stress : Not on file  Social Connections:   . Frequency of Communication with Friends and Family: Not on file  . Frequency of Social Gatherings with Friends and Family: Not on file  . Attends Religious Services: Not on file  . Active Member of Clubs or Organizations: Not on file  . Attends Banker Meetings: Not on file  . Marital Status: Not on file  Intimate Partner Violence:   . Fear of Current or Ex-Partner: Not on file  . Emotionally Abused: Not on file  . Physically Abused: Not on file  . Sexually Abused: Not on file    Family History  Problem Relation Age of Onset  . Hyperlipidemia Father   . Hypertension Father   . Glaucoma Father   . Cancer Sister   . Leukemia Sister   . Leukemia Paternal Uncle      Review of Systems  Constitutional: Negative.  Negative for chills and fever.  HENT: Positive for congestion. Negative for ear discharge,  ear pain, nosebleeds and sore throat.   Respiratory: Negative.  Negative for cough and shortness of breath.   Cardiovascular: Negative.  Negative for chest pain and palpitations.  Gastrointestinal: Negative.  Negative for abdominal pain, blood in stool, diarrhea, nausea and vomiting.  Genitourinary: Negative.  Negative for dysuria and hematuria.  Musculoskeletal: Negative.  Negative for myalgias.  Skin: Negative.  Negative for rash.  Neurological: Positive for headaches. Negative for dizziness.  All other systems reviewed and are negative.   Today's Vitals   02/25/19 1602  BP: 135/78  Pulse: 69  Resp: 16  Temp: 97.8 F (36.6 C)  TempSrc: Oral  SpO2: 99%  Weight: 184 lb (83.5 kg)  Height: 5' 4.75" (1.645 m)   Body mass index is 30.86 kg/m.  Physical Exam Vitals  reviewed.  Constitutional:      Appearance: Normal appearance.  HENT:     Head: Normocephalic.     Left Ear: Tympanic membrane, ear canal and external ear normal.     Nose: Nose normal.     Mouth/Throat:     Mouth: Mucous membranes are moist.     Pharynx: Oropharynx is clear. No oropharyngeal exudate or posterior oropharyngeal erythema.  Eyes:     Extraocular Movements: Extraocular movements intact.     Conjunctiva/sclera: Conjunctivae normal.     Pupils: Pupils are equal, round, and reactive to light.  Neck:     Vascular: No carotid bruit.  Cardiovascular:     Rate and Rhythm: Normal rate and regular rhythm.     Heart sounds: Normal heart sounds.  Pulmonary:     Effort: Pulmonary effort is normal.     Breath sounds: Normal breath sounds.  Musculoskeletal:        General: Normal range of motion.     Cervical back: Normal range of motion and neck supple. No tenderness.  Lymphadenopathy:     Cervical: No cervical adenopathy.  Skin:    Capillary Refill: Capillary refill takes less than 2 seconds.  Neurological:     General: No focal deficit present.     Mental Status: She is alert and oriented to person, place, and time.     Cranial Nerves: No cranial nerve deficit.     Sensory: No sensory deficit.     Motor: No weakness.  Psychiatric:        Mood and Affect: Mood normal.        Behavior: Behavior normal.      ASSESSMENT & PLAN: Traci Grant was seen today for sinus problem.  Diagnoses and all orders for this visit:  Sinus pain Comments: Possible sinusitis Orders: -     amoxicillin-clavulanate (AUGMENTIN) 875-125 MG tablet; Take 1 tablet by mouth 2 (two) times daily for 7 days.    Patient Instructions       If you have lab work done today you will be contacted with your lab results within the next 2 weeks.  If you have not heard from [place] then please contact us. The fastest way to get your results is to register for My Chart.   IF you received an x-ray today, you  will receive an invoice from [facility]. Please contact [facility] at [phone] with questions or concerns regarding your invoice.   IF you received labwork today, you will receive an invoice from [place]. Please contact [facility] at [phone] with questions or concerns regarding your invoice.   Our billing staff will not be able to assist you with questions regarding bills from  these companies.  You will be contacted with the lab results as soon as they are available. The fastest way to get your results is to activate your My Chart account. Instructions are located on the last page of this paperwork. If you have not heard from us regarding the results in 2 weeks, please contact this office.     Sinus Headache  A sinus headache happens when your sinuses get swollen or blocked (clogged). Sinuses are spaces behind the bones of your face and forehead. You may feel pain or pressure in your face, forehead, ears, or upper teeth. Sinus headaches can be mild or very bad. Follow these instructions at home: General instructions  If told: ? Apply a warm, moist washcloth to your face. This can help to lessen pain. ? Use a nasal saline wash. Follow the directions on the bottle or box. Medicines   Take over-the-counter and prescription medicines only as told by your doctor.  If you were prescribed an antibiotic medicine, take it as told by your doctor. Do not stop taking it even if you start to feel better.  Use a nose spray if your nose feels full of mucus (congested). Hydrate and humidify  Drink enough water to keep your pee (urine) pale yellow.  Use a cool mist humidifier to keep the humidity level in your home above 50%.  Breathe in steam for 10-15 minutes, 3-4 times a day or as told by your doctor. You can do this in the bathroom while a hot shower is running.  Try not to spend time in cool or dry air. Contact a doctor if:  You get more than one headache a  week.  Light or sound bothers you.  You have a fever.  You feel sick to your stomach (nauseous) or you throw up (vomit).  Your headaches do not get better with treatment. Get help right away if:  You have trouble seeing.  You suddenly have very bad pain in your face or head.  You start to have quick, sudden movements or shaking that you cannot control (seizure).  You are confused.  You have a stiff neck. Summary  A sinus headache happens when your sinuses get swollen or blocked (clogged). Sinuses are spaces behind the bones of your face and forehead.  You may feel pain or pressure in your face, forehead, ears, or upper teeth.  Take over-the-counter and prescription medicines only as told by your doctor.  If told, apply a warm, moist washcloth to your face. This can help to lessen pain. This information is not intended to replace advice given to you by your health care provider. Make sure you discuss any questions you have with your health care provider. Document Released: 07/04/2010 Document Revised: 02/14/2017 Document Reviewed: 12/13/2016 Elsevier Patient Education  2020 Elsevier Inc.      Edwina BarthMiguel Erika Hussar, MD [facility]

## 2019-02-25 NOTE — Patient Instructions (Addendum)
If you have lab work done today you will be contacted with your lab results within the next 2 weeks.  If you have not heard from [place] then please contact us. The fastest way to get your results is to register for My Chart.   IF you received an x-ray today, you will receive an invoice from [facility]. Please contact [facility] at [phone] with questions or concerns regarding your invoice.   IF you received labwork today, you will receive an invoice from [place]. Please contact [facility] at [phone] with questions or concerns regarding your invoice.   Our billing staff will not be able to assist you with questions regarding bills from these companies.  You will be contacted with the lab results as soon as they are available. The fastest way to get your results is to activate your My Chart account. Instructions are located on the last page of this paperwork. If you have not heard from [place] regarding the results in 2 weeks, please contact this office.     Sinus Headache  A sinus headache happens when your sinuses get swollen or blocked (clogged). Sinuses are spaces behind the bones of your face and forehead. You may feel pain or pressure in your face, forehead, ears, or upper teeth. Sinus headaches can be mild or very bad. Follow these instructions at home: General instructions  If told: ? Apply a warm, moist washcloth to your face. This can help to lessen pain. ? Use a nasal saline wash. Follow the directions on the bottle or box. Medicines   Take over-the-counter and prescription medicines only as told by your doctor.  If you were prescribed an antibiotic medicine, take it as told by your doctor. Do not stop taking it even if you start to feel better.  Use a nose spray if your nose feels full of mucus (congested). Hydrate and humidify  Drink enough water to keep your pee (urine) pale yellow.  Use a cool mist humidifier to keep the humidity level in your  home above 50%.  Breathe in steam for 10-15 minutes, 3-4 times a day or as told by your doctor. You can do this in the bathroom while a hot shower is running.  Try not to spend time in cool or dry air. Contact a doctor if:  You get more than one headache a week.  Light or sound bothers you.  You have a fever.  You feel sick to your stomach (nauseous) or you throw up (vomit).  Your headaches do not get better with treatment. Get help right away if:  You have trouble seeing.  You suddenly have very bad pain in your face or head.  You start to have quick, sudden movements or shaking that you cannot control (seizure).  You are confused.  You have a stiff neck. Summary  A sinus headache happens when your sinuses get swollen or blocked (clogged). Sinuses are spaces behind the bones of your face and forehead.  You may feel pain or pressure in your face, forehead, ears, or upper teeth.  Take over-the-counter and prescription medicines only as told by your doctor.  If told, apply a warm, moist washcloth to your face. This can help to lessen pain. This information is not intended to replace advice given to you by your health care provider. Make sure you discuss any questions you have with your health care provider. Document Released: 07/04/2010 Document Revised: 02/14/2017 Document Reviewed: 12/13/2016 Elsevier Patient Education  2020  [employer].

## 2019-03-08 ENCOUNTER — Other Ambulatory Visit: Payer: Self-pay

## 2019-03-08 ENCOUNTER — Encounter: Payer: Self-pay | Admitting: Emergency Medicine

## 2019-03-08 ENCOUNTER — Ambulatory Visit (INDEPENDENT_AMBULATORY_CARE_PROVIDER_SITE_OTHER): Payer: BC Managed Care – PPO | Admitting: Emergency Medicine

## 2019-03-08 VITALS — BP 149/87 | HR 67 | Temp 97.6°F | Resp 16 | Ht 66.0 in | Wt 186.0 lb

## 2019-03-08 DIAGNOSIS — G500 Trigeminal neuralgia: Secondary | ICD-10-CM

## 2019-03-08 DIAGNOSIS — R7303 Prediabetes: Secondary | ICD-10-CM

## 2019-03-08 DIAGNOSIS — R519 Headache, unspecified: Secondary | ICD-10-CM

## 2019-03-08 MED ORDER — CARBAMAZEPINE ER 100 MG PO TB12: 100.0000 mg | ORAL_TABLET | Freq: Two times a day (BID) | ORAL | 1 refills | Status: DC

## 2019-03-08 NOTE — Patient Instructions (Addendum)
If you have lab work done today you will be contacted with your lab results within the next 2 weeks.  If you have not heard from [place] then please contact us. The fastest way to get your results is to register for My Chart.   IF you received an x-ray today, you will receive an invoice from [facility]. Please contact [facility] at [phone] with questions or concerns regarding your invoice.   IF you received labwork today, you will receive an invoice from [place]. Please contact [facility] at [phone] with questions or concerns regarding your invoice.   Our billing staff will not be able to assist you with questions regarding bills from these companies.  You will be contacted with the lab results as soon as they are available. The fastest way to get your results is to activate your My Chart account. Instructions are located on the last page of this paperwork. If you have not heard from [place] regarding the results in 2 weeks, please contact this office.     Trigeminal Neuralgia  Trigeminal neuralgia is a nerve disorder that causes severe pain on one side of the face. The pain may last from a few seconds to several minutes. The pain is usually only on one side of the face. Symptoms may occur for days, weeks, or months and then go away for months or years. The pain may return and be worse than before. What are the causes? This condition is caused by damage or pressure to a nerve in the head that is called the trigeminal nerve. An attack can be triggered by:  Talking.  Chewing.  Putting on makeup.  Washing your face.  Shaving your face.  Brushing your teeth.  Touching your face. What increases the risk? You are more likely to develop this condition if you:  Are 64 years of age or older.  Are female. What are the signs or symptoms? The main symptom of this condition is severe pain in  the:  Jaw.  Lips.  Eyes.  Nose.  Scalp.  Forehead.  Face. The pain may be:  Intense.  Stabbing.  Electric.  Shock-like. How is this diagnosed? This condition is diagnosed with a physical exam. A CT scan or an MRI may be done to rule out other conditions that can cause facial pain. How is this treated? This condition may be treated with:  Avoiding the things that trigger your symptoms.  Taking prescription medicines (anticonvulsants).  Having surgery. This may be done in severe cases if other medical treatment does not provide relief.  Having procedures such as ablation, thermal, or radiation therapy. It may take up to one month for treatment to start relieving the pain. Follow these instructions at home: Managing pain  Learn as much as you can about how to manage your pain. Ask your health care provider if a pain specialist would be helpful.  Consider talking with a mental health care provider (psychologist) about how to cope with the pain.  Consider joining a pain support group. General instructions  Take over-the-counter and prescription medicines only as told by your health care provider.  Avoid the things that trigger your symptoms. It may help to: ? Chew on the unaffected side of your mouth. ? Avoid touching your face. ? Avoid blasts of hot or cold air.  Follow your treatment plan as told by your health care provider. This may include: ? Cognitive or behavioral therapy. ? Gentle, regular exercise. ? Meditation or  yoga. ? Aromatherapy.  Keep all follow-up visits as told by your health care provider. You may need to be monitored closely to make sure treatment is working well for you. Where to find more information  Facial Pain Association: fpa-support.org Contact a health care provider if:  Your medicine is not helping your symptoms.  You have side effects from the medicine used for treatment.  You develop new, unexplained symptoms, such  as: ? Double vision. ? Facial weakness. ? Facial numbness. ? Changes in hearing or balance.  You feel depressed. Get help right away if:  Your pain is severe and is not getting better.  You develop suicidal thoughts. If you ever feel like you may hurt yourself or others, or have thoughts about taking your own life, get help right away. You can go to your nearest emergency department or call:  Your local emergency services (911 in the [place]  A suicide crisis helpline, such as the [place] at 937-647-1045. This is open 24 hours a day. Summary  Trigeminal neuralgia is a nerve disorder that causes severe pain on one side of the face. The pain may last from a few seconds to several minutes.  This condition is caused by damage or pressure to a nerve in the head that is called the trigeminal nerve.  Treatment may include avoiding the things that trigger your symptoms, taking medicines, or having surgery or procedures. It may take up to one month for treatment to start relieving the pain.  Avoid the things that trigger your symptoms.  Keep all follow-up visits as told by your health care provider. You may need to be monitored closely to make sure treatment is working well for you. This information is not intended to replace advice given to you by your health care provider. Make sure you discuss any questions you have with your health care provider. Document Released: 03/01/2000 Document Revised: 01/19/2018 Document Reviewed: 01/19/2018 Elsevier Patient Education  2020 [employer].

## 2019-03-08 NOTE — Progress Notes (Addendum)
Traci Grant 58 y.o.   Chief Complaint  Patient presents with  . Facial Pain    x2 weeks pt still has the headache.    HISTORY OF PRESENT ILLNESS: This is a 58 y.o. female complaining of intermittent left facial pain for the past several weeks.  Describes pain as sharp, localized to left side of her face, intermittent at times lasting seconds to minutes, worse with cold, when she lies down, brushing her teeth.  No associated symptoms.  Recently treated for suspected sinus infection with Augmentin but no significant improvement.  No new symptoms just not better.  No history of migraine headaches.  Mother gets similar headaches.  Non-smoker.  Denies any other significant symptoms.  HPI   Prior to Admission medications   Medication Sig Start Date End Date Taking? Authorizing Provider  cyclobenzaprine (FLEXERIL) 10 MG tablet Take 1 tablet (10 mg total) by mouth at bedtime. Patient not taking: Reported on 02/25/2019 11/26/18   Georgina QuintSagardia, Guenther Dunshee Jose, MD    Allergies  Allergen Reactions  . Lactose Intolerance (Gi) Diarrhea    There are no problems to display for this patient.   History reviewed. No pertinent past medical history.  Past Surgical History:  Procedure Laterality Date  . TUBAL LIGATION      Social History   Socioeconomic History  . Marital status: Married    Spouse name: Not on file  . Number of children: Not on file  . Years of education: Not on file  . Highest education level: Not on file  Occupational History  . Not on file  Tobacco Use  . Smoking status: Never Smoker  . Smokeless tobacco: Never Used  Substance and Sexual Activity  . Alcohol use: No  . Drug use: No  . Sexual activity: Never    Partners: Male    Birth control/protection: Surgical  Other Topics Concern  . Not on file  Social History Narrative  . Not on file   Social Determinants of Health   Financial Resource Strain:   . Difficulty of Paying Living Expenses: Not on file  Food  Insecurity:   . Worried About Programme researcher, broadcasting/film/videounning Out of Food in the Last Year: Not on file  . Ran Out of Food in the Last Year: Not on file  Transportation Needs:   . Lack of Transportation (Medical): Not on file  . Lack of Transportation (Non-Medical): Not on file  Physical Activity:   . Days of Exercise per Week: Not on file  . Minutes of Exercise per Session: Not on file  Stress:   . Feeling of Stress : Not on file  Social Connections:   . Frequency of Communication with Friends and Family: Not on file  . Frequency of Social Gatherings with Friends and Family: Not on file  . Attends Religious Services: Not on file  . Active Member of Clubs or Organizations: Not on file  . Attends BankerClub or Organization Meetings: Not on file  . Marital Status: Not on file  Intimate Partner Violence:   . Fear of Current or Ex-Partner: Not on file  . Emotionally Abused: Not on file  . Physically Abused: Not on file  . Sexually Abused: Not on file    Family History  Problem Relation Age of Onset  . Hyperlipidemia Father   . Hypertension Father   . Glaucoma Father   . Cancer Sister   . Leukemia Sister   . Leukemia Paternal Uncle      Review of Systems  Constitutional: Negative.  Negative for chills and fever.  HENT: Negative.  Negative for congestion, ear pain, hearing loss, nosebleeds, sinus pain and sore throat.   Eyes: Negative.  Negative for blurred vision, double vision, pain and redness.  Respiratory: Negative.  Negative for cough and shortness of breath.   Cardiovascular: Negative.  Negative for chest pain and palpitations.  Gastrointestinal: Negative.  Negative for abdominal pain, blood in stool, diarrhea, nausea and vomiting.  Genitourinary: Negative.  Negative for dysuria and hematuria.  Musculoskeletal: Negative for back pain, myalgias and neck pain.  Skin: Negative.  Negative for rash.  Neurological: Positive for headaches (Facial pain, left-sided). Negative for dizziness, sensory change,  focal weakness, seizures and loss of consciousness.  All other systems reviewed and are negative.  Today's Vitals   03/08/19 1143  BP: (!) 149/87  Pulse: 67  Resp: 16  Temp: 97.6 F (36.4 C)  TempSrc: Oral  SpO2: 100%  Weight: 186 lb (84.4 kg)  Height: 5\' 6"  (1.676 m)   Body mass index is 30.02 kg/m.   Physical Exam Vitals reviewed.  HENT:     Head: Normocephalic.  Eyes:     Extraocular Movements: Extraocular movements intact.     Conjunctiva/sclera: Conjunctivae normal.     Pupils: Pupils are equal, round, and reactive to light.  Cardiovascular:     Rate and Rhythm: Normal rate and regular rhythm.     Heart sounds: Normal heart sounds.  Pulmonary:     Effort: Pulmonary effort is normal.     Breath sounds: Normal breath sounds.  Musculoskeletal:        General: Normal range of motion.     Cervical back: Normal range of motion and neck supple.  Skin:    General: Skin is warm and dry.     Capillary Refill: Capillary refill takes less than 2 seconds.  Neurological:     General: No focal deficit present.     Mental Status: She is alert and oriented to person, place, and time.  Psychiatric:        Mood and Affect: Mood normal.        Behavior: Behavior normal.    A total of 25 minutes was spent in the room with the patient, greater than 50% of which was in counseling/coordination of care regarding differential diagnosis, treatment, medication side effects, need for diagnostic work-up including blood work and neurology evaluation, prognosis, and need for follow-up.   ASSESSMENT & PLAN: Traci Grant was seen today for facial pain.  Diagnoses and all orders for this visit:  Facial pain -     CBC with Differential -     Comprehensive metabolic panel -     Lipid panel -     Hemoglobin A1c -     Sedimentation Rate  Trigeminal neuralgia of left side of face -     carbamazepine (TEGRETOL-XR) 100 MG 12 hr tablet; Take 1 tablet (100 mg total) by mouth 2 (two) times daily. -      Ambulatory referral to Neurology  Prediabetes     Patient Instructions       If you have lab work done today you will be contacted with your lab results within the next 2 weeks.  If you have not heard from [place] then please contact us. The fastest way to get your results is to register for My Chart.   IF you received an x-ray today, you will receive an invoice from [name]. Please contact [facility] at  [phone] with questions or concerns regarding your invoice.   IF you received labwork today, you will receive an invoice from [facility]. Please contact [facility] at [phone] with questions or concerns regarding your invoice.   Our billing staff will not be able to assist you with questions regarding bills from these companies.  You will be contacted with the lab results as soon as they are available. The fastest way to get your results is to activate your My Chart account. Instructions are located on the last page of this paperwork. If you have not heard from [place] regarding the results in 2 weeks, please contact this office.     Trigeminal Neuralgia  Trigeminal neuralgia is a nerve disorder that causes severe pain on one side of the face. The pain may last from a few seconds to several minutes. The pain is usually only on one side of the face. Symptoms may occur for days, weeks, or months and then go away for months or years. The pain may return and be worse than before. What are the causes? This condition is caused by damage or pressure to a nerve in the head that is called the trigeminal nerve. An attack can be triggered by:  Talking.  Chewing.  Putting on makeup.  Washing your face.  Shaving your face.  Brushing your teeth.  Touching your face. What increases the risk? You are more likely to develop this condition if you:  Are 42 years of age or older.  Are female. What are the signs or symptoms? The main symptom of this condition is  severe pain in the:  Jaw.  Lips.  Eyes.  Nose.  Scalp.  Forehead.  Face. The pain may be:  Intense.  Stabbing.  Electric.  Shock-like. How is this diagnosed? This condition is diagnosed with a physical exam. A CT scan or an MRI may be done to rule out other conditions that can cause facial pain. How is this treated? This condition may be treated with:  Avoiding the things that trigger your symptoms.  Taking prescription medicines (anticonvulsants).  Having surgery. This may be done in severe cases if other medical treatment does not provide relief.  Having procedures such as ablation, thermal, or radiation therapy. It may take up to one month for treatment to start relieving the pain. Follow these instructions at home: Managing pain  Learn as much as you can about how to manage your pain. Ask your health care provider if a pain specialist would be helpful.  Consider talking with a mental health care provider (psychologist) about how to cope with the pain.  Consider joining a pain support group. General instructions  Take over-the-counter and prescription medicines only as told by your health care provider.  Avoid the things that trigger your symptoms. It may help to: ? Chew on the unaffected side of your mouth. ? Avoid touching your face. ? Avoid blasts of hot or cold air.  Follow your treatment plan as told by your health care provider. This may include: ? Cognitive or behavioral therapy. ? Gentle, regular exercise. ? Meditation or yoga. ? Aromatherapy.  Keep all follow-up visits as told by your health care provider. You may need to be monitored closely to make sure treatment is working well for you. Where to find more information  Facial Pain Association: fpa-support.org Contact a health care provider if:  Your medicine is not helping your symptoms.  You have side effects from the medicine used for treatment.  You develop  new, unexplained  symptoms, such as: ? Double vision. ? Facial weakness. ? Facial numbness. ? Changes in hearing or balance.  You feel depressed. Get help right away if:  Your pain is severe and is not getting better.  You develop suicidal thoughts. If you ever feel like you may hurt yourself or others, or have thoughts about taking your own life, get help right away. You can go to your nearest emergency department or call:  Your local emergency services (911 in the [place]  A suicide crisis helpline, such as the National Suicide Prevention Lifeline at 435-152-0880. This is open 24 hours a day. Summary  Trigeminal neuralgia is a nerve disorder that causes severe pain on one side of the face. The pain may last from a few seconds to several minutes.  This condition is caused by damage or pressure to a nerve in the head that is called the trigeminal nerve.  Treatment may include avoiding the things that trigger your symptoms, taking medicines, or having surgery or procedures. It may take up to one month for treatment to start relieving the pain.  Avoid the things that trigger your symptoms.  Keep all follow-up visits as told by your health care provider. You may need to be monitored closely to make sure treatment is working well for you. This information is not intended to replace advice given to you by your health care provider. Make sure you discuss any questions you have with your health care provider. Document Released: 03/01/2000 Document Revised: 01/19/2018 Document Reviewed: 01/19/2018 Elsevier Patient Education  2020 Elsevier Inc.      Edwina Barth, MD [facility]

## 2019-03-09 ENCOUNTER — Encounter: Payer: Self-pay | Admitting: Neurology

## 2019-03-09 ENCOUNTER — Ambulatory Visit: Payer: BC Managed Care – PPO | Admitting: Neurology

## 2019-03-09 VITALS — BP 146/94 | HR 63 | Temp 97.2°F | Ht 66.0 in | Wt 188.3 lb

## 2019-03-09 DIAGNOSIS — Z820 Family history of epilepsy and other diseases of the nervous system: Secondary | ICD-10-CM

## 2019-03-09 DIAGNOSIS — G500 Trigeminal neuralgia: Secondary | ICD-10-CM

## 2019-03-09 LAB — COMPREHENSIVE METABOLIC PANEL
ALT: 12 IU/L (ref 0–32)
AST: 24 IU/L (ref 0–40)
Albumin/Globulin Ratio: 1.4 (ref 1.2–2.2)
Albumin: 4.2 g/dL (ref 3.8–4.9)
Alkaline Phosphatase: 126 IU/L — ABNORMAL HIGH (ref 39–117)
BUN/Creatinine Ratio: 9 (ref 9–23)
BUN: 8 mg/dL (ref 6–24)
Bilirubin Total: 0.4 mg/dL (ref 0.0–1.2)
CO2: 24 mmol/L (ref 20–29)
Calcium: 9.5 mg/dL (ref 8.7–10.2)
Chloride: 108 mmol/L — ABNORMAL HIGH (ref 96–106)
Creatinine, Ser: 0.92 mg/dL (ref 0.57–1.00)
GFR calc Af Amer: 80 mL/min/{1.73_m2} (ref >59)
GFR calc non Af Amer: 69 mL/min/{1.73_m2} (ref >59)
Globulin, Total: 2.9 g/dL (ref 1.5–4.5)
Glucose: 81 mg/dL (ref 65–99)
Potassium: 4.2 mmol/L (ref 3.5–5.2)
Sodium: 145 mmol/L — ABNORMAL HIGH (ref 134–144)
Total Protein: 7.1 g/dL (ref 6.0–8.5)

## 2019-03-09 LAB — LIPID PANEL
Chol/HDL Ratio: 5.6 ratio — ABNORMAL HIGH (ref 0.0–4.4)
Cholesterol, Total: 179 mg/dL (ref 100–199)
HDL: 32 mg/dL — ABNORMAL LOW (ref >39)
LDL Chol Calc (NIH): 132 mg/dL — ABNORMAL HIGH (ref 0–99)
Triglycerides: 80 mg/dL (ref 0–149)
VLDL Cholesterol Cal: 15 mg/dL (ref 5–40)

## 2019-03-09 LAB — CBC WITH DIFFERENTIAL/PLATELET
Basophils Absolute: 0 10*3/uL (ref 0.0–0.2)
Basos: 1 %
EOS (ABSOLUTE): 0.1 10*3/uL (ref 0.0–0.4)
Eos: 1 %
Hematocrit: 46.1 % (ref 34.0–46.6)
Hemoglobin: 14.2 g/dL (ref 11.1–15.9)
Immature Grans (Abs): 0 10*3/uL (ref 0.0–0.1)
Immature Granulocytes: 0 %
Lymphocytes Absolute: 2.5 10*3/uL (ref 0.7–3.1)
Lymphs: 60 %
MCH: 26.4 pg — ABNORMAL LOW (ref 26.6–33.0)
MCHC: 30.8 g/dL — ABNORMAL LOW (ref 31.5–35.7)
MCV: 86 fL (ref 79–97)
Monocytes Absolute: 0.3 10*3/uL (ref 0.1–0.9)
Monocytes: 7 %
Neutrophils Absolute: 1.3 10*3/uL — ABNORMAL LOW (ref 1.4–7.0)
Neutrophils: 31 %
Platelets: 231 10*3/uL (ref 150–450)
RBC: 5.38 x10E6/uL — ABNORMAL HIGH (ref 3.77–5.28)
RDW: 14.6 % (ref 11.7–15.4)
WBC: 4.2 10*3/uL (ref 3.4–10.8)

## 2019-03-09 LAB — SEDIMENTATION RATE: Sed Rate: 11 mm/hr (ref 0–40)

## 2019-03-09 LAB — HEMOGLOBIN A1C
Est. average glucose Bld gHb Est-mCnc: 120 mg/dL
Hgb A1c MFr Bld: 5.8 % — ABNORMAL HIGH (ref 4.8–5.6)

## 2019-03-09 NOTE — Progress Notes (Signed)
Subjective:    Patient ID: TOBIN CADIENTE is a 58 y.o. female.  HPI     Star Age, MD, PhD [facility] [address] [place], [place] [address]  Dear Dr. Mitchel Honour,  I saw your patient, Lagretta Loseke, upon your kind request in my neurologic clinic today for initial consultation of her facial pain, concern for left-sided trigeminal neuralgia.  The patient is unaccompanied today.  As you know, Ms. Declercq is a 58 year old right-handed woman with an underlying with the exception of borderline obesity, who reports a longstanding history of intermittent left facial pain, overall duration of over 10 years, maybe even over 15 years all in all.  Symptoms have been rather intermittent and short-lived in the past until about 3 or 4 weeks ago when she started having longer periods of electric shocklike pain affecting the left mid face area, along the jawline also, also in the left temple area and behind the eyes, after the jolts subside, she has a sore feeling, almost a tenderness to touch in those areas.  She had an eye examination and her prescription was stable.  She had a dental evaluation, recent tooth cleaning appointment which was benign.  In the past, she had very rare and intermittent symptoms, never actually took any medication for this, no work-up for this, and attributed this to dental pain in the past.  She had no recent triggers with the exception that when she started having the more consistent symptoms a few weeks ago she remembers that she went outside in the evening and when her face was hit by the cold air it started hurting.  Feels like a jolt of pain, may happen several times within 5 minutes to up to 20 minutes at a time and happen several times a day.  I reviewed your office note from 02/25/2019 as well as 03/08/2019.  She was just started on Tegretol-XR for concern for left-sided trigeminal neuralgia.  She was treated with Augmentin for a  suspected sinusitis.  She had recent blood work through your office which I reviewed from 03/08/2019, kidney function and liver function were okay, A1c was on the lower end of the prediabetes range at 5.8.  She reports that she filled the Tegretol prescription which is 100 mg twice daily but has not started it yet.  She is somewhat fearful of the side effects and implications of taking daily medication.  We also had her daughter on speaker phone for portions of the visit today and the daughter asked several good questions which I answered, patient reports that her daughter is a Marine scientist.  She has had recent stress, she works as a Patent examiner, currently remotely, she also worries about her husband's health who is 15 years old.  She lives with her husband and her youngest daughter who is 58, she has a total of 3 daughters.  Patient is a non-smoker and does not drink alcohol and drinks very limited caffeine in the form of tea.  She tries to hydrate well with water.  She denies any history of migraines, she denies any sudden onset of one-sided weakness or numbness or tingling or slurring of speech or droopy face. Interestingly, she recalls that her mother has had similar symptoms for years, and also her sister has similar symptoms.   Her Past Medical History Is Significant For: No past medical history on file.  Her Past Surgical History Is Significant For: Past Surgical History:  Procedure Laterality Date  .  TUBAL LIGATION      Her Family History Is Significant For: Family History  Problem Relation Age of Onset  . Hyperlipidemia Father   . Hypertension Father   . Glaucoma Father   . Cancer Sister   . Leukemia Sister   . Leukemia Paternal Uncle     Her Social History Is Significant For: Social History   Socioeconomic History  . Marital status: Married    Spouse name: Not on file  . Number of children: Not on file  . Years of education: Not on file  . Highest education level: Not on  file  Occupational History  . Not on file  Tobacco Use  . Smoking status: Never Smoker  . Smokeless tobacco: Never Used  Substance and Sexual Activity  . Alcohol use: No  . Drug use: No  . Sexual activity: Never    Partners: Male    Birth control/protection: Surgical  Other Topics Concern  . Not on file  Social History Narrative  . Not on file   Social Determinants of Health   Financial Resource Strain:   . Difficulty of Paying Living Expenses: Not on file  Food Insecurity:   . Worried About Programme researcher, broadcasting/film/video in the Last Year: Not on file  . Ran Out of Food in the Last Year: Not on file  Transportation Needs:   . Lack of Transportation (Medical): Not on file  . Lack of Transportation (Non-Medical): Not on file  Physical Activity:   . Days of Exercise per Week: Not on file  . Minutes of Exercise per Session: Not on file  Stress:   . Feeling of Stress : Not on file  Social Connections:   . Frequency of Communication with Friends and Family: Not on file  . Frequency of Social Gatherings with Friends and Family: Not on file  . Attends Religious Services: Not on file  . Active Member of Clubs or Organizations: Not on file  . Attends Banker Meetings: Not on file  . Marital Status: Not on file    Her Allergies Are:  Allergies  Allergen Reactions  . Lactose Intolerance (Gi) Diarrhea  :   Her Current Medications Are:  Outpatient Encounter Medications as of 03/09/2019  Medication Sig  . carbamazepine (TEGRETOL-XR) 100 MG 12 hr tablet Take 1 tablet (100 mg total) by mouth 2 (two) times daily.  . [DISCONTINUED] cyclobenzaprine (FLEXERIL) 10 MG tablet Take 1 tablet (10 mg total) by mouth at bedtime. (Patient not taking: Reported on 02/25/2019)   No facility-administered encounter medications on file as of 03/09/2019.  :   Review of Systems:  Out of a complete 14 point review of systems, all are reviewed and negative with the exception of these symptoms as  listed below:  Review of Systems  Neurological:       Referred by Edwina Barth, MD for Trigeminal Neuralgia. Sts constants left sided facial pain. Sts sx have worsened over the past 3 weeks. Just started tegretol yesterday has not noticed any change as of yet.     Objective:  Neurological Exam  Physical Exam Physical Examination:   Vitals:   03/09/19 1544  BP: (!) 146/94  Pulse: 63  Temp: (!) 97.2 F (36.2 C)   General Examination: The patient is a very pleasant 58 y.o. female in no acute distress. She appears well-developed and well-nourished and well groomed.   HEENT: Normocephalic, atraumatic, pupils are equal, round and reactive to light and accommodation. Funduscopic exam  is normal with sharp disc margins noted. Extraocular tracking is good without limitation to gaze excursion or nystagmus noted. Normal smooth pursuit is noted. Hearing is grossly intact. Face is symmetric with normal facial animation and normal facial sensation. Speech is clear with no dysarthria noted. There is no hypophonia. There is no lip, neck/head, jaw or voice tremor. Neck is supple with full range of passive and active motion. There are no carotid bruits on auscultation. Oropharynx exam reveals: mild mouth dryness, adequate dental hygiene. Tongue protrudes centrally and palate elevates symmetrically.   Chest: Clear to auscultation without wheezing, rhonchi or crackles noted.  Heart: S1+S2+0, regular and normal without murmurs, rubs or gallops noted.   Abdomen: Soft, non-tender and non-distended with normal bowel sounds appreciated on auscultation.  Extremities: There is no pitting edema in the distal lower extremities bilaterally. Pedal pulses are intact.  Skin: Warm and dry without trophic changes noted.  Musculoskeletal: exam reveals no obvious joint deformities, tenderness or joint swelling or erythema.   Neurologically:  Mental status: The patient is awake, alert and oriented in all 4 spheres.  Her immediate and remote memory, attention, language skills and fund of knowledge are appropriate. There is no evidence of aphasia, agnosia, apraxia or anomia. Speech is clear with normal prosody and enunciation. Thought process is linear. Mood is normal and affect is normal.  Cranial nerves II - XII are as described above under HEENT exam. In addition: shoulder shrug is normal with equal shoulder height noted. Motor exam: Normal bulk, strength and tone is noted. There is no drift, tremor or rebound. Romberg is negative. Reflexes are 2+ throughout. Babinski: Toes are flexor bilaterally. Fine motor skills and coordination: intact with normal finger taps, normal hand movements, normal rapid alternating patting, normal foot taps and normal foot agility.  Cerebellar testing: No dysmetria or intention tremor on finger to nose testing. Heel to shin is unremarkable bilaterally. There is no truncal or gait ataxia.  Sensory exam: intact to light touch, pinprick, vibration, temperature sense in the upper and lower extremities.  Gait, station and balance: She stands easily. No veering to one side is noted. No leaning to one side is noted. Posture is age-appropriate and stance is narrow based. Gait shows normal stride length and normal pace. No problems turning are noted. Tandem walk is unremarkable.      Assessment and Plan:   In summary, Thurmon Fairmily M Schnyder is a very pleasant 58 y.o.-year old female with an underlying with the exception of borderline obesity, whose history And physical exam are in keeping with left-sided trigeminal neuralgia.  She has an otherwise nonfocal exam, interestingly, she has a family history of similar symptoms affecting her mother and her sister.  She had recent blood work through your office which I reviewed.  She Was provided with a prescription for low-dose Tegretol long-acting, 100 mg twice daily yesterday. She has filled the prescription but has not started taking it.  She is advised that  this is a good choice and in fact first-line treatment for trigeminal neuralgia, I went over the potential side effects with her and encouraged her to start taking the medication, if she wishes to start on a lower dose she can certainly start it once daily at bedtime for the first few days or a week and then increase it to twice daily.  She is advised that she may need routine blood work on a regular basis to monitor her blood cell count, electrolytes, especially her sodium level as low  sodium has been associated with taking Tegretol at times as well as low white cell count in rare instances.  She is advised that I would like to proceed with a brain MRI with special attention to the trigeminal nerve, with and without contrast.  We will try to set this up soon for her, she does report having some claustrophobia but declined a prescription for a small dose of Xanax for her MRI today.  If she changes her mind she is in urged to call our office.  She is willing to proceed with the scan.  She is advised that we will call her with her MRI results as soon as they are available.She is advised to follow-up routinely in this office in 3 months, sooner if needed.  I answered all her questions today and also answered her daughter's questions who was on speaker phone for the first part of the visit.  Both the patient and her daughter were in agreement with the plan.  Thank you very much for allowing me to participate in the care of this nice patient. If I can be of any further assistance to you please do not hesitate to call me at [phone].  Sincerely,   Huston Foley, MD, PhD

## 2019-03-09 NOTE — Patient Instructions (Addendum)
I do believe you likely have left-sided trigeminal neuralgia.  I agree with the approach of treating this symptomatically with Tegretol long-acting, you have filled the prescription provided by Dr. Mitchel Honour yesterday, go ahead and start taking this, if you want to ease in, you can start with 1 pill once daily, he did prescribed a lower dose. You can take 1 pill once daily at night and increase it to twice daily after a week if you like.   I would recommend we proceed with a MRI of the brain, with special attention to the trigeminal nerve area, with and without contrast.  We will call you with the results, I will follow-up in 3 months, sooner if needed.

## 2019-04-13 ENCOUNTER — Other Ambulatory Visit: Payer: Self-pay

## 2019-04-13 ENCOUNTER — Ambulatory Visit
Admission: RE | Admit: 2019-04-13 | Discharge: 2019-04-13 | Disposition: A | Discharge status: Home / Self Care | Payer: BC Managed Care – PPO | Source: Ambulatory Visit | Attending: Neurology | Admitting: Neurology

## 2019-04-13 DIAGNOSIS — G500 Trigeminal neuralgia: Secondary | ICD-10-CM

## 2019-04-13 DIAGNOSIS — Z820 Family history of epilepsy and other diseases of the nervous system: Secondary | ICD-10-CM

## 2019-04-13 MED ORDER — GADOBENATE DIMEGLUMINE 529 MG/ML IV SOLN
17.0000 mL | Freq: Once | INTRAVENOUS | Status: AC | PRN
  Administered 2019-04-13: 17 mL via INTRAVENOUS

## 2019-04-15 NOTE — Progress Notes (Signed)
Please call and advise patient that her facial/trigem MRI w/wo contrast was reported as normal. She can FU as scheduled.

## 2019-04-19 ENCOUNTER — Telehealth: Payer: Self-pay

## 2019-04-19 NOTE — Telephone Encounter (Signed)
I reached out to the pt and advised of MRI findings. Pt verbalized understanding and requested 06/07/2019 be moved to a sooner appointment date. Pt's appointment has been moved to 04/28/2019 to discuss MRI report.

## 2019-04-19 NOTE — Telephone Encounter (Signed)
Error

## 2019-04-19 NOTE — Telephone Encounter (Signed)
-----   Message from Huston Foley, MD sent at 04/15/2019  6:17 PM EST ----- Please call and advise patient that her facial/trigem MRI w/wo contrast was reported as normal. She can FU as scheduled.

## 2019-04-28 ENCOUNTER — Ambulatory Visit: Payer: BC Managed Care – PPO | Admitting: Neurology

## 2019-04-28 ENCOUNTER — Encounter: Payer: Self-pay | Admitting: Neurology

## 2019-04-28 ENCOUNTER — Other Ambulatory Visit: Payer: Self-pay

## 2019-04-28 VITALS — BP 136/84 | HR 59 | Temp 96.9°F | Ht 64.0 in | Wt 186.3 lb

## 2019-04-28 DIAGNOSIS — G500 Trigeminal neuralgia: Secondary | ICD-10-CM | POA: Diagnosis not present

## 2019-04-28 MED ORDER — GABAPENTIN 100 MG PO CAPS: 100.0000 mg | ORAL_CAPSULE | Freq: Every evening | ORAL | 5 refills | Status: DC | PRN

## 2019-04-28 NOTE — Patient Instructions (Signed)
Your brain/facial MRI was benign, thankfully, I am glad to hear that you are actually feeling a little better.  Nevertheless, since you end up taking Tylenol almost every day, I suggest you start taking gabapentin as needed, start at night because it can make you little drowsy.  It is low dose, 100 mg strength and you can use it every night if needed and we can increase it with time if needed.  Please follow-up routinely to see one of our nurse practitioners in 6 months.  Start Neurontin (gabapentin) 100 mg strength: Take 1 pill daily as needed. The most common side effects reported are sedation or sleepiness. Rare side effects include balance problems, confusion.

## 2019-04-28 NOTE — Progress Notes (Signed)
Subjective:    Traci Grant ID: Traci Grant is a 59 y.o. female.  HPI     Interim history:   Traci Grant is a 59 year old right-handed woman with an underlying with the exception of borderline obesity, who presents for follow-up consultation of Traci Grant left-sided facial pain, concern for trigeminal neuralgia.  The Traci Grant is unaccompanied today.  I first met Traci Grant at the request of Traci Grant primary care physician on 03/09/2019, at which time Traci Grant reported intermittent left facial pain for several years.  Traci Grant had a prescription for Tegretol but had not started it yet.  Traci Grant was encouraged to start it.  I suggested we proceed with a trigeminal MRI.  Traci Grant had a facial/trigeminal MRI with and without contrast on 04/13/2019 and I reviewed the results: IMPRESSION:    Unremarkable MRI face/trigeminal with and without.  No abnormal enhancing or compressive lesions.   We called Traci Grant with the test results.  Today, 04/28/2019: Traci Grant reports feeling a little better.  The severity of Traci Grant facial pain attacks are less.  Nevertheless, Traci Grant ends up taking Tylenol almost on a daily basis at least once, either in the morning or in the evening.  Before, around December 2020 Traci Grant pain was not responding to Tylenol and would come right back, now Traci Grant feels that when Traci Grant takes a Tylenol at the initial onset of the pain it does not build up to a severe pain.  Traci Grant never started the Tegretol for fear of side effects.  Traci Grant would be willing to try gabapentin as needed.  Traci Grant has been taking Zyrtec every day generic, Traci Grant is suffering from drainage first thing in the morning particularly from the left side of the nose.  Traci Grant has not seen ENT.  Traci Grant has a mucous retention cyst and is wondering if that is the reason for drainage.  Traci Grant is wondering if that will just go away on its own.    The Traci Grant's allergies, current medications, family history, past medical history, past social history, past surgical history and problem list were reviewed and  updated as appropriate.   Previously:  03/09/2019: (Traci Grant) reports a longstanding history of intermittent left facial pain, overall duration of over 10 years, maybe even over 15 years all in all.  Symptoms have been rather intermittent and short-lived in the past until about 3 or 4 weeks ago when Traci Grant started having longer periods of electric shocklike pain affecting the left mid face area, along the jawline also, also in the left temple area and behind the eyes, after the jolts subside, Traci Grant has a sore feeling, almost a tenderness to touch in those areas.  Traci Grant had an eye examination and Traci Grant prescription was stable.  Traci Grant had a dental evaluation, recent tooth cleaning appointment which was benign.  In the past, Traci Grant had very rare and intermittent symptoms, never actually took any medication for this, no work-up for this, and attributed this to dental pain in the past.  Traci Grant had no recent triggers with the exception that when Traci Grant started having the more consistent symptoms a few weeks ago Traci Grant remembers that Traci Grant went outside in the evening and when Traci Grant face was hit by the cold air it started hurting.  Feels like a jolt of pain, may happen several times within 5 minutes to up to 20 minutes at a time and happen several times a day.  I reviewed your office note from 02/25/2019 as well as 03/08/2019.  Traci Grant was just started on Tegretol-XR for concern for left-sided  trigeminal neuralgia.  Traci Grant was treated with Augmentin for a suspected sinusitis.  Traci Grant had recent blood work through your office which I reviewed from 03/08/2019, kidney function and liver function were okay, A1c was on the lower end of the prediabetes range at 5.8.  Traci Grant reports that Traci Grant filled the Tegretol prescription which is 100 mg twice daily but has not started it yet.  Traci Grant is somewhat fearful of the side effects and implications of taking daily medication.  We also had Traci Grant daughter on speaker phone for portions of the visit today and the daughter asked several  good questions which I answered, Traci Grant reports that Traci Grant daughter is a Marine scientist.  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  Traci Grant lives with Traci Grant husband and Traci Grant youngest daughter who is 58, Traci Grant has a total of 3 daughters.  Traci Grant is a non-smoker and does not drink alcohol and drinks very limited caffeine in the form of tea.  Traci Grant tries to hydrate well with water.  Traci Grant denies any history of migraines, Traci Grant denies any sudden onset of one-sided weakness or numbness or tingling or slurring of speech or droopy face. Interestingly, Traci Grant recalls that Traci Grant mother has had similar symptoms for years, and also Traci Grant sister has similar symptoms.    Traci Grant Past Medical History Is Significant For: History reviewed. No pertinent past medical history.  Traci Grant Past Surgical History Is Significant For: Past Surgical History:  Procedure Laterality Date  . TUBAL LIGATION      Traci Grant Family History Is Significant For: Family History  Problem Relation Age of Onset  . Hyperlipidemia Father   . Hypertension Father   . Glaucoma Father   . Cancer Sister   . Leukemia Sister   . Leukemia Paternal Uncle     Traci Grant Social History Is Significant For: Social History   Socioeconomic History  . Marital status: Married    Spouse name: Not on file  . Number of children: Not on file  . Years of education: Not on file  . Highest education level: Not on file  Occupational History  . Not on file  Tobacco Use  . Smoking status: Never Smoker  . Smokeless tobacco: Never Used  Substance and Sexual Activity  . Alcohol use: No  . Drug use: No  . Sexual activity: Never    Partners: Male    Birth control/protection: Surgical  Other Topics Concern  . Not on file  Social History Narrative  . Not on file   Social Determinants of Health   Financial Resource Strain:   . Difficulty of Paying Living Expenses: Not on file  Food  Insecurity:   . Worried About Charity fundraiser in the Last Year: Not on file  . Ran Out of Food in the Last Year: Not on file  Transportation Needs:   . Lack of Transportation (Medical): Not on file  . Lack of Transportation (Non-Medical): Not on file  Physical Activity:   . Days of Exercise per Week: Not on file  . Minutes of Exercise per Session: Not on file  Stress:   . Feeling of Stress : Not on file  Social Connections:   . Frequency of Communication with Friends and Family: Not on file  . Frequency of Social Gatherings with Friends and Family: Not on file  . Attends Religious Services: Not on file  . Active Member of  Clubs or Organizations: Not on file  . Attends Archivist Meetings: Not on file  . Marital Status: Not on file    Traci Grant Allergies Are:  Allergies  Allergen Reactions  . Lactose Intolerance (Gi) Diarrhea  :   Traci Grant Current Medications Are:  Outpatient Encounter Medications as of 04/28/2019  Medication Sig  . gabapentin (NEURONTIN) 100 MG capsule Take 1 capsule (100 mg total) by mouth at bedtime as needed.  . [DISCONTINUED] carbamazepine (TEGRETOL-XR) 100 MG 12 hr tablet Take 1 tablet (100 mg total) by mouth 2 (two) times daily.   No facility-administered encounter medications on file as of 04/28/2019.  :  Review of Systems:  Out of a complete 14 point review of systems, all are reviewed and negative with the exception of these symptoms as listed below: Review of Systems  Neurological:       Here to f/u on recent MRI. Pt reports trigeminal neuralgia sx have improved. Traci Grant never started the Tegretol. Traci Grant reports Traci Grant was concerned about unwanted side effects.     Objective:  Neurological Exam  Physical Exam Physical Examination:   Vitals:   04/28/19 0828  BP: 136/84  Pulse: (!) 59  Temp: (!) 96.9 F (36.1 C)    General Examination: The Traci Grant is a very pleasant 59 y.o. female in no acute distress. Traci Grant appears well-developed and  well-nourished and well groomed.   HEENT: Normocephalic, atraumatic, pupils are equal, round and reactive to light, Extraocular tracking is preserved, face is symmetric with normal facial sensation, no hypersensitivity to light touch on the left side of the face, airway examination reveals mild to moderate mouth dryness, otherwise stable findings, tongue protrudes centrally in palate elevates symmetrically, no carotid bruits.  Chest: Clear to auscultation without wheezing, rhonchi or crackles noted.  Heart: S1+S2+0, regular and normal without murmurs, rubs or gallops noted.   Abdomen: Soft, non-tender and non-distended with normal bowel sounds appreciated on auscultation.  Extremities: There is no pitting edema in the distal lower extremities bilaterally.   Skin: Warm and dry without trophic changes noted.  Musculoskeletal: exam reveals no obvious joint deformities, tenderness or joint swelling or erythema.   Neurologically:  Mental status: The Traci Grant is awake, alert and oriented in all 4 spheres. Traci Grant immediate and remote memory, attention, language skills and fund of knowledge are appropriate. There is no evidence of aphasia, agnosia, apraxia or anomia. Speech is clear with normal prosody and enunciation. Thought process is linear. Mood is normal and affect is normal.  Cranial nerves II - XII are as described above under HEENT exam. Motor exam: Normal bulk, strength and tone is noted. There is no drift, tremor or rebound. Romberg is negative. Reflexes are 1-2+ throughout. Fine motor skills and coordination: grossly intact.  Cerebellar testing: No dysmetria or intention tremor. There is no truncal or gait ataxia.  Sensory exam: intact to light touch.  Gait, station and balance: Traci Grant stands easily. No veering to one side is noted. No leaning to one side is noted. Posture is age-appropriate and stance is narrow based. Gait shows normal stride length and normal pace. No problems turning are  noted. Tandem walk is unremarkable.      Assessment and Plan:   In summary, Traci Grant is a very pleasant 59 year old female with an underlying with the exception of borderline obesity, who Presents for follow-up consultation of Traci Grant left-sided facial pain with history and exam and keeping with trigeminal neuralgia.  Traci Grant does report a family history  of this type of pain affecting Traci Grant mother and Traci Grant sister.  Traci Grant had been given a prescription for low-dose Tegretol by Traci Grant primary care physician but never actually started it.  Traci Grant feels that Traci Grant is overall a little better to where Tylenol seems to keep the pain at [place] when Traci Grant takes it early enough, Traci Grant takes it nearly daily however.  Traci Grant had a facial/trigeminal MRI which was benign, we talked about the results.  Traci Grant physical exam and neurological exam are nonfocal thankfully.  Traci Grant has not had any new symptoms.  Traci Grant is encouraged to try gabapentin and low-dose as needed.  Traci Grant can take it once daily, preferably at night at least in the beginning.  We talked about expectations, side effects and limitations of the medication, Traci Grant can increase it over time if needed.  Traci Grant is agreeable to trying it. To that end I gave Traci Grant prescription for gabapentin 100 mg strength daily as needed.  Traci Grant is advised to follow-up routinely to see one of our nurse practitioners in 6 months, sooner if needed.  Traci Grant is encouraged to call or email through [facility] for any interim questions or concerns. I answered all Traci Grant questions today and Traci Grant was in agreement.  I spent 30 minutes in total face-to-face time and in reviewing records during pre-charting, more than 50% of which was spent in counseling and coordination of care, reviewing test results, reviewing medications and treatment regimen and/or in discussing or reviewing the diagnosis of TN, the prognosis and treatment options. Pertinent laboratory and imaging test results that were available during this visit with the Traci Grant were  reviewed by me and considered in my medical decision making (see chart for details).

## 2019-05-03 ENCOUNTER — Ambulatory Visit: Payer: BC Managed Care – PPO | Admitting: Emergency Medicine

## 2019-05-03 ENCOUNTER — Other Ambulatory Visit: Payer: Self-pay

## 2019-05-03 ENCOUNTER — Encounter: Payer: Self-pay | Admitting: Emergency Medicine

## 2019-05-03 VITALS — BP 129/78 | HR 69 | Temp 97.2°F | Resp 16 | Ht 64.0 in | Wt 185.0 lb

## 2019-05-03 DIAGNOSIS — G500 Trigeminal neuralgia: Secondary | ICD-10-CM | POA: Diagnosis not present

## 2019-05-03 DIAGNOSIS — Z1211 Encounter for screening for malignant neoplasm of colon: Secondary | ICD-10-CM

## 2019-05-03 DIAGNOSIS — J341 Cyst and mucocele of nose and nasal sinus: Secondary | ICD-10-CM | POA: Diagnosis not present

## 2019-05-03 NOTE — Patient Instructions (Addendum)
   If you have lab work done today you will be contacted with your lab results within the next 2 weeks.  If you have not heard from us then please contact us. The fastest way to get your results is to register for My Chart.   IF you received an x-ray today, you will receive an invoice from [facility]. Please contact  Radiology at [phone] with questions or concerns regarding your invoice.   IF you received labwork today, you will receive an invoice from [facility]. Please contact [facility] at [phone] with questions or concerns regarding your invoice.   Our billing staff will not be able to assist you with questions regarding bills from these companies.  You will be contacted with the lab results as soon as they are available. The fastest way to get your results is to activate your My Chart account. Instructions are located on the last page of this paperwork. If you have not heard from us regarding the results in 2 weeks, please contact this office.      Trigeminal Neuralgia  Trigeminal neuralgia is a nerve disorder that causes severe pain on one side of the face. The pain may last from a few seconds to several minutes. The pain is usually only on one side of the face. Symptoms may occur for days, weeks, or months and then go away for months or years. The pain may return and be worse than before. What are the causes? This condition is caused by damage or pressure to a nerve in the head that is called the trigeminal nerve. An attack can be triggered by:  Talking.  Chewing.  Putting on makeup.  Washing your face.  Shaving your face.  Brushing your teeth.  Touching your face. What increases the risk? You are more likely to develop this condition if you:  Are 50 years of age or older.  Are female. What are the signs or symptoms? The main symptom of this condition is severe pain in  the:  Jaw.  Lips.  Eyes.  Nose.  Scalp.  Forehead.  Face. The pain may be:  Intense.  Stabbing.  Electric.  Shock-like. How is this diagnosed? This condition is diagnosed with a physical exam. A CT scan or an MRI may be done to rule out other conditions that can cause facial pain. How is this treated? This condition may be treated with:  Avoiding the things that trigger your symptoms.  Taking prescription medicines (anticonvulsants).  Having surgery. This may be done in severe cases if other medical treatment does not provide relief.  Having procedures such as ablation, thermal, or radiation therapy. It may take up to one month for treatment to start relieving the pain. Follow these instructions at home: Managing pain  Learn as much as you can about how to manage your pain. Ask your health care provider if a pain specialist would be helpful.  Consider talking with a mental health care provider (psychologist) about how to cope with the pain.  Consider joining a pain support group. General instructions  Take over-the-counter and prescription medicines only as told by your health care provider.  Avoid the things that trigger your symptoms. It may help to: ? Chew on the unaffected side of your mouth. ? Avoid touching your face. ? Avoid blasts of hot or cold air.  Follow your treatment plan as told by your health care provider. This may include: ? Cognitive or behavioral therapy. ? Gentle, regular exercise. ? Meditation   or yoga. ? Aromatherapy.  Keep all follow-up visits as told by your health care provider. You may need to be monitored closely to make sure treatment is working well for you. Where to find more information  Facial Pain Association: fpa-support.org Contact a health care provider if:  Your medicine is not helping your symptoms.  You have side effects from the medicine used for treatment.  You develop new, unexplained symptoms, such  as: ? Double vision. ? Facial weakness. ? Facial numbness. ? Changes in hearing or balance.  You feel depressed. Get help right away if:  Your pain is severe and is not getting better.  You develop suicidal thoughts. If you ever feel like you may hurt yourself or others, or have thoughts about taking your own life, get help right away. You can go to your nearest emergency department or call:  Your local emergency services (911 in the [place]  A suicide crisis helpline, such as the National Suicide Prevention Lifeline at 1-800-273-8255. This is open 24 hours a day. Summary  Trigeminal neuralgia is a nerve disorder that causes severe pain on one side of the face. The pain may last from a few seconds to several minutes.  This condition is caused by damage or pressure to a nerve in the head that is called the trigeminal nerve.  Treatment may include avoiding the things that trigger your symptoms, taking medicines, or having surgery or procedures. It may take up to one month for treatment to start relieving the pain.  Avoid the things that trigger your symptoms.  Keep all follow-up visits as told by your health care provider. You may need to be monitored closely to make sure treatment is working well for you. This information is not intended to replace advice given to you by your health care provider. Make sure you discuss any questions you have with your health care provider. Document Revised: 01/19/2018 Document Reviewed: 01/19/2018 Elsevier Patient Education  2020 [employer]  

## 2019-05-03 NOTE — Progress Notes (Signed)
Assessmentand Plan:   In summary,Traci Grant a very pleasant 58 year oldfemalewith an underlying with the exception of borderline obesity, who Presents for follow-up consultation of her left-sided facial pain with history and exam and keeping with trigeminal neuralgia.  She does report a family history of this type of pain affecting her mother and her sister.  She had been given a prescription for low-dose Tegretol by her primary care physician but never actually started it.  She feels that she is overall a little better to where Tylenol seems to keep the pain at bay when she takes it early enough, she takes it nearly daily however.  She had a facial/trigeminal MRI which was benign, we talked about the results.  Her physical exam and neurological exam are nonfocal thankfully.  She has not had any new symptoms.  She is encouraged to try gabapentin and low-dose as needed.  She can take it once daily, preferably at night at least in the beginning.  We talked about expectations, side effects and limitations of the medication, she can increase it over time if needed.  She is agreeable to trying it. To that end I gave her prescription for gabapentin 100 mg strength daily as needed.  She is advised to follow-up routinely to see one of our nurse practitioners in 6 months, sooner if needed. Traci Grant 59 y.o.   Chief Complaint  Patient presents with  . MRI    per pt to discuss results L side of face    HISTORY OF PRESENT ILLNESS: This is a 60 y.o. female here for follow-up of trigeminal neuralgia on the left side of her face. Saw neurologist as referred.  Office notes reviewed.  See above for assessment and plan. Had MRI done.  Imaging results reviewed.  No significant findings except for retention cyst on maxillary sinus. Patient never started Tegretol.  Afraid of side effects.  Instead started taking gabapentin 100 mg at bedtime as needed. States Tylenol helping a lot. Overall feels  better.  No other complaints or medical concerns.  HPI   Prior to Admission medications   Medication Sig Start Date End Date Taking? Authorizing Provider  gabapentin (NEURONTIN) 100 MG capsule Take 1 capsule (100 mg total) by mouth at bedtime as needed. 04/28/19  Yes Huston Foley, MD    Allergies  Allergen Reactions  . Lactose Intolerance (Gi) Diarrhea    There are no problems to display for this patient.   No past medical history on file.  Past Surgical History:  Procedure Laterality Date  . TUBAL LIGATION      Social History   Socioeconomic History  . Marital status: Married    Spouse name: Not on file  . Number of children: Not on file  . Years of education: Not on file  . Highest education level: Not on file  Occupational History  . Not on file  Tobacco Use  . Smoking status: Never Smoker  . Smokeless tobacco: Never Used  Substance and Sexual Activity  . Alcohol use: No  . Drug use: No  . Sexual activity: Never    Partners: Male    Birth control/protection: Surgical  Other Topics Concern  . Not on file  Social History Narrative  . Not on file   Social Determinants of Health   Financial Resource Strain:   . Difficulty of Paying Living Expenses: Not on file  Food Insecurity:   . Worried About Programme researcher, broadcasting/film/video in the Last Year: Not  on file  . Ran Out of Food in the Last Year: Not on file  Transportation Needs:   . Lack of Transportation (Medical): Not on file  . Lack of Transportation (Non-Medical): Not on file  Physical Activity:   . Days of Exercise per Week: Not on file  . Minutes of Exercise per Session: Not on file  Stress:   . Feeling of Stress : Not on file  Social Connections:   . Frequency of Communication with Friends and Family: Not on file  . Frequency of Social Gatherings with Friends and Family: Not on file  . Attends Religious Services: Not on file  . Active Member of Clubs or Organizations: Not on file  . Attends Tax inspector Meetings: Not on file  . Marital Status: Not on file  Intimate Partner Violence:   . Fear of Current or Ex-Partner: Not on file  . Emotionally Abused: Not on file  . Physically Abused: Not on file  . Sexually Abused: Not on file    Family History  Problem Relation Age of Onset  . Hyperlipidemia Father   . Hypertension Father   . Glaucoma Father   . Cancer Sister   . Leukemia Sister   . Leukemia Paternal Uncle      Review of Systems  Constitutional: Negative.  Negative for chills and fever.  HENT: Negative.  Negative for congestion and sore throat.   Eyes: Negative for blurred vision and double vision.  Respiratory: Negative.  Negative for cough and shortness of breath.   Cardiovascular: Negative.  Negative for chest pain and palpitations.  Gastrointestinal: Negative.  Negative for abdominal pain, diarrhea, nausea and vomiting.  Genitourinary: Negative.  Negative for hematuria.  Skin: Negative.  Negative for rash.  Neurological: Negative.  Negative for dizziness and headaches.  All other systems reviewed and are negative.  Today's Vitals   05/03/19 1000  BP: 129/78  Pulse: 69  Resp: 16  Temp: (!) 97.2 F (36.2 C)  TempSrc: Temporal  SpO2: 100%  Weight: 185 lb (83.9 kg)  Height: 5\' 4"  (1.626 m)   Body mass index is 31.76 kg/m.   Physical Exam Vitals reviewed.  Constitutional:      Appearance: Normal appearance.  HENT:     Head: Normocephalic.  Eyes:     Extraocular Movements: Extraocular movements intact.     Conjunctiva/sclera: Conjunctivae normal.  Cardiovascular:     Rate and Rhythm: Normal rate.     Heart sounds: Normal heart sounds.  Pulmonary:     Effort: Pulmonary effort is normal.  Musculoskeletal:        General: Normal range of motion.     Cervical back: Normal range of motion and neck supple.  Skin:    General: Skin is warm and dry.     Capillary Refill: Capillary refill takes less than 2 seconds.  Neurological:     General:  No focal deficit present.     Mental Status: She is alert and oriented to person, place, and time.  Psychiatric:        Mood and Affect: Mood normal.        Behavior: Behavior normal.      ASSESSMENT & PLAN: Clinically stable and responding to treatment.  No medical concerns identified during this visit.  Traci Grant was seen today for mri.  Diagnoses and all orders for this visit:  Trigeminal neuralgia of left side of face  Retention cyst of nasal sinus -     Ambulatory  referral to ENT    Patient Instructions       If you have lab work done today you will be contacted with your lab results within the next 2 weeks.  If you have not heard from [place] then please contact us. The fastest way to get your results is to register for My Chart.   IF you received an x-ray today, you will receive an invoice from [facility]. Please contact [facility] at [phone] with questions or concerns regarding your invoice.   IF you received labwork today, you will receive an invoice from [facility]. Please contact [facility] at [phone] with questions or concerns regarding your invoice.   Our billing staff will not be able to assist you with questions regarding bills from these companies.  You will be contacted with the lab results as soon as they are available. The fastest way to get your results is to activate your My Chart account. Instructions are located on the last page of this paperwork. If you have not heard from [place] regarding the results in 2 weeks, please contact this office.     Trigeminal Neuralgia  Trigeminal neuralgia is a nerve disorder that causes severe pain on one side of the face. The pain may last from a few seconds to several minutes. The pain is usually only on one side of the face. Symptoms may occur for days, weeks, or months and then go away for months or years. The pain may return and be worse than before. What are the causes? This condition is caused  by damage or pressure to a nerve in the head that is called the trigeminal nerve. An attack can be triggered by:  Talking.  Chewing.  Putting on makeup.  Washing your face.  Shaving your face.  Brushing your teeth.  Touching your face. What increases the risk? You are more likely to develop this condition if you:  Are 39 years of age or older.  Are female. What are the signs or symptoms? The main symptom of this condition is severe pain in the:  Jaw.  Lips.  Eyes.  Nose.  Scalp.  Forehead.  Face. The pain may be:  Intense.  Stabbing.  Electric.  Shock-like. How is this diagnosed? This condition is diagnosed with a physical exam. A CT scan or an MRI may be done to rule out other conditions that can cause facial pain. How is this treated? This condition may be treated with:  Avoiding the things that trigger your symptoms.  Taking prescription medicines (anticonvulsants).  Having surgery. This may be done in severe cases if other medical treatment does not provide relief.  Having procedures such as ablation, thermal, or radiation therapy. It may take up to one month for treatment to start relieving the pain. Follow these instructions at home: Managing pain  Learn as much as you can about how to manage your pain. Ask your health care provider if a pain specialist would be helpful.  Consider talking with a mental health care provider (psychologist) about how to cope with the pain.  Consider joining a pain support group. General instructions  Take over-the-counter and prescription medicines only as told by your health care provider.  Avoid the things that trigger your symptoms. It may help to: ? Chew on the unaffected side of your mouth. ? Avoid touching your face. ? Avoid blasts of hot or cold air.  Follow your treatment plan as told by your health care provider. This may include: ?  Cognitive or behavioral therapy. ? Gentle, regular exercise.  ? Meditation or yoga. ? Aromatherapy.  Keep all follow-up visits as told by your health care provider. You may need to be monitored closely to make sure treatment is working well for you. Where to find more information  Facial Pain Association: fpa-support.org Contact a health care provider if:  Your medicine is not helping your symptoms.  You have side effects from the medicine used for treatment.  You develop new, unexplained symptoms, such as: ? Double vision. ? Facial weakness. ? Facial numbness. ? Changes in hearing or balance.  You feel depressed. Get help right away if:  Your pain is severe and is not getting better.  You develop suicidal thoughts. If you ever feel like you may hurt yourself or others, or have thoughts about taking your own life, get help right away. You can go to your nearest emergency department or call:  Your local emergency services (911 in the [place]  A suicide crisis helpline, such as the [facility] at (734)344-3199. This is open 24 hours a day. Summary  Trigeminal neuralgia is a nerve disorder that causes severe pain on one side of the face. The pain may last from a few seconds to several minutes.  This condition is caused by damage or pressure to a nerve in the head that is called the trigeminal nerve.  Treatment may include avoiding the things that trigger your symptoms, taking medicines, or having surgery or procedures. It may take up to one month for treatment to start relieving the pain.  Avoid the things that trigger your symptoms.  Keep all follow-up visits as told by your health care provider. You may need to be monitored closely to make sure treatment is working well for you. This information is not intended to replace advice given to you by your health care provider. Make sure you discuss any questions you have with your health care provider. Document Revised: 01/19/2018 Document Reviewed: 01/19/2018  Elsevier Patient Education  2020 Elsevier Inc.      Agustina Caroli, MD [facility]

## 2019-05-03 NOTE — Addendum Note (Signed)
Addended by: Georg Ruddle A on: 05/03/2019 10:52 AM   Modules accepted: Orders

## 2019-05-11 ENCOUNTER — Ambulatory Visit (INDEPENDENT_AMBULATORY_CARE_PROVIDER_SITE_OTHER): Payer: BC Managed Care – PPO | Admitting: Otolaryngology

## 2019-05-14 ENCOUNTER — Telehealth: Payer: Self-pay | Admitting: Emergency Medicine

## 2019-05-14 NOTE — Telephone Encounter (Signed)
Pt dropped off paperwork in a sealed envelope. Pt requested copies of paperwork after pcp filled and signed forms.  FMLA - pt did not pay $15  Forms left in provider's box at nurses station

## 2019-05-19 ENCOUNTER — Telehealth: Payer: Self-pay | Admitting: *Deleted

## 2019-05-19 NOTE — Telephone Encounter (Signed)
Spoke to patient concerning FMLA paper work she dropped off to be completed. I spoke to her and the leave will be to care for there spouse who has dementia. 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

## 2019-06-07 ENCOUNTER — Ambulatory Visit: Payer: BC Managed Care – PPO | Admitting: Neurology

## 2019-10-26 ENCOUNTER — Ambulatory Visit: Payer: BC Managed Care – PPO | Admitting: Adult Health

## 2019-10-26 ENCOUNTER — Encounter: Payer: Self-pay | Admitting: Adult Health

## 2019-11-01 ENCOUNTER — Ambulatory Visit: Payer: BC Managed Care – PPO | Admitting: Emergency Medicine

## 2020-01-18 ENCOUNTER — Telehealth: Payer: Self-pay | Admitting: *Deleted

## 2020-01-18 NOTE — Telephone Encounter (Signed)
Schedule a mammogram on the mobile unit

## 2020-06-25 ENCOUNTER — Encounter: Payer: Self-pay | Admitting: Emergency Medicine

## 2022-11-06 ENCOUNTER — Encounter: Payer: Self-pay | Admitting: Emergency Medicine

## 2022-11-06 ENCOUNTER — Ambulatory Visit: Payer: Self-pay | Admitting: Emergency Medicine

## 2023-03-04 ENCOUNTER — Encounter: Payer: Self-pay | Admitting: Emergency Medicine

## 2023-04-18 ENCOUNTER — Telehealth: Payer: Self-pay | Admitting: Emergency Medicine

## 2023-07-15 ENCOUNTER — Ambulatory Visit (INDEPENDENT_AMBULATORY_CARE_PROVIDER_SITE_OTHER): Payer: Self-pay | Admitting: Emergency Medicine

## 2023-07-15 ENCOUNTER — Encounter: Payer: Self-pay | Admitting: Emergency Medicine

## 2023-07-15 VITALS — BP 140/90 | HR 74 | Temp 97.4°F | Ht 64.0 in | Wt 195.4 lb

## 2023-07-15 DIAGNOSIS — Z130 Encounter for screening for diseases of the blood and blood-forming organs and certain disorders involving the immune mechanism: Secondary | ICD-10-CM

## 2023-07-15 DIAGNOSIS — J341 Cyst and mucocele of nose and nasal sinus: Secondary | ICD-10-CM

## 2023-07-15 DIAGNOSIS — Z1231 Encounter for screening mammogram for malignant neoplasm of breast: Secondary | ICD-10-CM

## 2023-07-15 DIAGNOSIS — Z13228 Encounter for screening for other metabolic disorders: Secondary | ICD-10-CM

## 2023-07-15 DIAGNOSIS — Z1211 Encounter for screening for malignant neoplasm of colon: Secondary | ICD-10-CM

## 2023-07-15 DIAGNOSIS — Z1329 Encounter for screening for other suspected endocrine disorder: Secondary | ICD-10-CM

## 2023-07-15 DIAGNOSIS — Z13220 Encounter for screening for lipoid disorders: Secondary | ICD-10-CM | POA: Diagnosis not present

## 2023-07-15 DIAGNOSIS — Z0000 Encounter for general adult medical examination without abnormal findings: Secondary | ICD-10-CM | POA: Diagnosis not present

## 2023-07-15 LAB — LIPID PANEL
Cholesterol: 181 mg/dL (ref 0–200)
HDL: 33.2 mg/dL — ABNORMAL LOW (ref >39.00)
LDL Cholesterol: 134 mg/dL — ABNORMAL HIGH (ref 0–99)
NonHDL: 147.64
Total CHOL/HDL Ratio: 5
Triglycerides: 69 mg/dL (ref 0.0–149.0)
VLDL: 13.8 mg/dL (ref 0.0–40.0)

## 2023-07-15 LAB — COMPREHENSIVE METABOLIC PANEL WITH GFR
ALT: 9 U/L (ref 0–35)
AST: 18 U/L (ref 0–37)
Albumin: 4.1 g/dL (ref 3.5–5.2)
Alkaline Phosphatase: 102 U/L (ref 39–117)
BUN: 10 mg/dL (ref 6–23)
CO2: 31 meq/L (ref 19–32)
Calcium: 9.4 mg/dL (ref 8.4–10.5)
Chloride: 108 meq/L (ref 96–112)
Creatinine, Ser: 0.97 mg/dL (ref 0.40–1.20)
GFR: 62.91 mL/min (ref >60.00)
Glucose, Bld: 97 mg/dL (ref 70–99)
Potassium: 3.8 meq/L (ref 3.5–5.1)
Sodium: 143 meq/L (ref 135–145)
Total Bilirubin: 0.7 mg/dL (ref 0.2–1.2)
Total Protein: 7.6 g/dL (ref 6.0–8.3)

## 2023-07-15 LAB — CBC WITH DIFFERENTIAL/PLATELET
Basophils Absolute: 0 10*3/uL (ref 0.0–0.1)
Basophils Relative: 0.6 % (ref 0.0–3.0)
Eosinophils Absolute: 0.1 10*3/uL (ref 0.0–0.7)
Eosinophils Relative: 1.4 % (ref 0.0–5.0)
HCT: 44.1 % (ref 36.0–46.0)
Hemoglobin: 14.2 g/dL (ref 12.0–15.0)
Lymphocytes Relative: 46.6 % — ABNORMAL HIGH (ref 12.0–46.0)
Lymphs Abs: 2.4 10*3/uL (ref 0.7–4.0)
MCHC: 32.2 g/dL (ref 30.0–36.0)
MCV: 84.7 fl (ref 78.0–100.0)
Monocytes Absolute: 0.4 10*3/uL (ref 0.1–1.0)
Monocytes Relative: 7.9 % (ref 3.0–12.0)
Neutro Abs: 2.3 10*3/uL (ref 1.4–7.7)
Neutrophils Relative %: 43.5 % (ref 43.0–77.0)
Platelets: 218 10*3/uL (ref 150.0–400.0)
RBC: 5.2 Mil/uL — ABNORMAL HIGH (ref 3.87–5.11)
RDW: 14.1 % (ref 11.5–15.5)
WBC: 5.2 10*3/uL (ref 4.0–10.5)

## 2023-07-15 LAB — HEMOGLOBIN A1C: Hgb A1c MFr Bld: 6.2 % (ref 4.6–6.5)

## 2023-07-15 NOTE — Progress Notes (Signed)
 Shalan Tahara Sime 63 y.o.   No chief complaint on file.   HISTORY OF PRESENT ILLNESS: This is a 63 y.o. female here for annual exam. Overall doing well. History of mucous retention cyst in right maxillary sinus as per MRI of 2021  HPI   Prior to Admission medications   Not on File    Allergies  Allergen Reactions   Lactose Intolerance (Gi) Diarrhea    There are no active problems to display for this patient.   History reviewed. No pertinent past medical history.  Past Surgical History:  Procedure Laterality Date   TUBAL LIGATION      Social History   Socioeconomic History   Marital status: Married    Spouse name: Not on file   Number of children: Not on file   Years of education: Not on file   Highest education level: Not on file  Occupational History   Not on file  Tobacco Use   Smoking status: Never   Smokeless tobacco: Never  Vaping Use   Vaping status: Never Used  Substance and Sexual Activity   Alcohol use: No   Drug use: No   Sexual activity: Never    Partners: Male    Birth control/protection: Surgical  Other Topics Concern   Not on file  Social History Narrative   Not on file   Social Drivers of Health   Financial Resource Strain: Not on file  Food Insecurity: Not on file  Transportation Needs: Not on file  Physical Activity: Not on file  Stress: Not on file  Social Connections: Not on file  Intimate Partner Violence: Not on file    Family History  Problem Relation Age of Onset   Hyperlipidemia Father    Hypertension Father    Glaucoma Father    Cancer Sister    Leukemia Sister    Leukemia Paternal Uncle      Review of Systems  Constitutional: Negative.  Negative for chills and fever.  HENT:  Positive for congestion.   Respiratory: Negative.  Negative for cough and shortness of breath.   Cardiovascular: Negative.  Negative for chest pain and palpitations.  Gastrointestinal:  Negative for abdominal pain, diarrhea, nausea and  vomiting.  Genitourinary: Negative.  Negative for dysuria and hematuria.  Skin: Negative.  Negative for rash.  Neurological: Negative.  Negative for dizziness and headaches.  All other systems reviewed and are negative.   Vitals:   07/15/23 0854  BP: (!) 140/90  Pulse: 74  Temp: (!) 97.4 F (36.3 C)  SpO2: 98%    Physical Exam Vitals reviewed.  Constitutional:      Appearance: Normal appearance.  HENT:     Head: Normocephalic.     Right Ear: Tympanic membrane, ear canal and external ear normal.     Left Ear: Tympanic membrane, ear canal and external ear normal.     Mouth/Throat:     Mouth: Mucous membranes are moist.     Pharynx: Oropharynx is clear.  Eyes:     Extraocular Movements: Extraocular movements intact.     Conjunctiva/sclera: Conjunctivae normal.     Pupils: Pupils are equal, round, and reactive to light.  Cardiovascular:     Rate and Rhythm: Normal rate and regular rhythm.     Pulses: Normal pulses.     Heart sounds: Normal heart sounds.  Pulmonary:     Effort: Pulmonary effort is normal.     Breath sounds: Normal breath sounds.  Musculoskeletal:  General: Normal range of motion.     Cervical back: No tenderness.  Lymphadenopathy:     Cervical: No cervical adenopathy.  Skin:    General: Skin is warm and dry.     Capillary Refill: Capillary refill takes less than 2 seconds.  Neurological:     General: No focal deficit present.     Mental Status: She is alert and oriented to person, place, and time.  Psychiatric:        Mood and Affect: Mood normal.        Behavior: Behavior normal.      ASSESSMENT & PLAN: Problem List Items Addressed This Visit   None Visit Diagnoses       Routine general medical examination at a health care facility    -  Primary   Relevant Orders   CBC with Differential/Platelet   Comprehensive metabolic panel with GFR   Hemoglobin A1c   Lipid panel   MM Digital Screening   Ambulatory referral to Gastroenterology      Mucous retention cyst of maxillary sinus       Relevant Orders   Ambulatory referral to ENT     Screening for deficiency anemia       Relevant Orders   CBC with Differential/Platelet     Screening for lipoid disorders       Relevant Orders   Lipid panel     Screening for endocrine, metabolic and immunity disorder       Relevant Orders   Comprehensive metabolic panel with GFR   Hemoglobin A1c     Screening for colon cancer       Relevant Orders   Ambulatory referral to Gastroenterology     Screening mammogram for breast cancer       Relevant Orders   MM Digital Screening      Modifiable risk factors discussed with patient. Anticipatory guidance according to age provided. The following topics were also discussed: Social Determinants of Health Smoking.  Non-smoker Diet and nutrition Benefits of exercise Cancer screening and need for breast and colon cancer screening with colonoscopy and  mammogram Vaccinations recommendations Cardiovascular risk assessment Mental health including depression and anxiety Fall and accident prevention  Patient Instructions  Health Maintenance, Female Adopting a healthy lifestyle and getting preventive care are important in promoting health and wellness. Ask your health care provider about: The right schedule for you to have regular tests and exams. Things you can do on your own to prevent diseases and keep yourself healthy. What should I know about diet, weight, and exercise? Eat a healthy diet  Eat a diet that includes plenty of vegetables, fruits, low-fat dairy products, and lean protein. Do not eat a lot of foods that are high in solid fats, added sugars, or sodium. Maintain a healthy weight Body mass index (BMI) is used to identify weight problems. It estimates body fat based on height and weight. Your health care provider can help determine your BMI and help you achieve or maintain a healthy weight. Get regular exercise Get regular  exercise. This is one of the most important things you can do for your health. Most adults should: Exercise for at least 150 minutes each week. The exercise should increase your heart rate and make you sweat (moderate-intensity exercise). Do strengthening exercises at least twice a week. This is in addition to the moderate-intensity exercise. Spend less time sitting. Even light physical activity can be beneficial. Watch cholesterol and blood lipids Have your blood tested  for lipids and cholesterol at 62 years of age, then have this test every 5 years. Have your cholesterol levels checked more often if: Your lipid or cholesterol levels are high. You are older than 62 years of age. You are at high risk for heart disease. What should I know about cancer screening? Depending on your health history and family history, you may need to have cancer screening at various ages. This may include screening for: Breast cancer. Cervical cancer. Colorectal cancer. Skin cancer. Lung cancer. What should I know about heart disease, diabetes, and high blood pressure? Blood pressure and heart disease High blood pressure causes heart disease and increases the risk of stroke. This is more likely to develop in people who have high blood pressure readings or are overweight. Have your blood pressure checked: Every 3-5 years if you are 62-44 years of age. Every year if you are 60 years old or older. Diabetes Have regular diabetes screenings. This checks your fasting blood sugar level. Have the screening done: Once every three years after age 17 if you are at a normal weight and have a low risk for diabetes. More often and at a younger age if you are overweight or have a high risk for diabetes. What should I know about preventing infection? Hepatitis B If you have a higher risk for hepatitis B, you should be screened for this virus. Talk with your health care provider to find out if you are at risk for hepatitis B  infection. Hepatitis C Testing is recommended for: Everyone born from [birth year] through 1964. Anyone with known risk factors for hepatitis C. Sexually transmitted infections (STIs) Get screened for STIs, including gonorrhea and chlamydia, if: You are sexually active and are younger than 62 years of age. You are older than 62 years of age and your health care provider tells you that you are at risk for this type of infection. Your sexual activity has changed since you were last screened, and you are at increased risk for chlamydia or gonorrhea. Ask your health care provider if you are at risk. Ask your health care provider about whether you are at high risk for HIV. Your health care provider may recommend a prescription medicine to help prevent HIV infection. If you choose to take medicine to prevent HIV, you should first get tested for HIV. You should then be tested every 3 months for as long as you are taking the medicine. Pregnancy If you are about to stop having your period (premenopausal) and you may become pregnant, seek counseling before you get pregnant. Take 400 to 800 micrograms (mcg) of folic acid every day if you become pregnant. Ask for birth control (contraception) if you want to prevent pregnancy. Osteoporosis and menopause Osteoporosis is a disease in which the bones lose minerals and strength with aging. This can result in bone fractures. If you are 60 years old or older, or if you are at risk for osteoporosis and fractures, ask your health care provider if you should: Be screened for bone loss. Take a calcium or vitamin D supplement to lower your risk of fractures. Be given hormone replacement therapy (HRT) to treat symptoms of menopause. Follow these instructions at home: Alcohol use Do not drink alcohol if: Your health care provider tells you not to drink. You are pregnant, may be pregnant, or are planning to become pregnant. If you drink alcohol: Limit how much you have  to: 0-1 drink a day. Know how much alcohol is in your drink.  In the [place] one drink equals one 12 oz bottle of beer (355 mL), one 5 oz glass of wine (148 mL), or one 1 oz glass of hard liquor (44 mL). Lifestyle Do not use any products that contain nicotine or tobacco. These products include cigarettes, chewing tobacco, and vaping devices, such as e-cigarettes. If you need help quitting, ask your health care provider. Do not use street drugs. Do not share needles. Ask your health care provider for help if you need support or information about quitting drugs. General instructions Schedule regular health, dental, and eye exams. Stay current with your vaccines. Tell your health care provider if: You often feel depressed. You have ever been abused or do not feel safe at home. Summary Adopting a healthy lifestyle and getting preventive care are important in promoting health and wellness. Follow your health care provider's instructions about healthy diet, exercising, and getting tested or screened for diseases. Follow your health care provider's instructions on monitoring your cholesterol and blood pressure. This information is not intended to replace advice given to you by your health care provider. Make sure you discuss any questions you have with your health care provider. Document Revised: 07/24/2020 Document Reviewed: 07/24/2020 Elsevier Patient Education  2024 Elsevier Inc.     Maryagnes Small, MD [facility]

## 2023-07-15 NOTE — Patient Instructions (Signed)

## 2023-07-18 ENCOUNTER — Ambulatory Visit
Admission: RE | Admit: 2023-07-18 | Discharge: 2023-07-18 | Disposition: A | Discharge status: Home / Self Care | Source: Ambulatory Visit | Attending: Emergency Medicine | Admitting: Emergency Medicine

## 2023-07-18 DIAGNOSIS — Z1231 Encounter for screening mammogram for malignant neoplasm of breast: Secondary | ICD-10-CM

## 2023-07-18 DIAGNOSIS — Z0000 Encounter for general adult medical examination without abnormal findings: Secondary | ICD-10-CM

## 2023-07-23 ENCOUNTER — Other Ambulatory Visit: Payer: Self-pay | Admitting: Emergency Medicine

## 2023-07-23 DIAGNOSIS — R928 Other abnormal and inconclusive findings on diagnostic imaging of breast: Secondary | ICD-10-CM

## 2023-07-24 ENCOUNTER — Encounter (INDEPENDENT_AMBULATORY_CARE_PROVIDER_SITE_OTHER): Payer: Self-pay | Admitting: Otolaryngology

## 2023-08-14 ENCOUNTER — Ambulatory Visit
Admission: RE | Admit: 2023-08-14 | Discharge: 2023-08-14 | Disposition: A | Discharge status: Home / Self Care | Source: Ambulatory Visit | Attending: Emergency Medicine

## 2023-08-14 ENCOUNTER — Ambulatory Visit
Admission: RE | Admit: 2023-08-14 | Discharge: 2023-08-14 | Disposition: A | Discharge status: Home / Self Care | Source: Ambulatory Visit | Attending: Emergency Medicine | Admitting: Emergency Medicine

## 2023-08-14 DIAGNOSIS — R928 Other abnormal and inconclusive findings on diagnostic imaging of breast: Secondary | ICD-10-CM

## 2023-08-19 ENCOUNTER — Encounter (INDEPENDENT_AMBULATORY_CARE_PROVIDER_SITE_OTHER): Payer: Self-pay

## 2023-11-18 ENCOUNTER — Encounter (INDEPENDENT_AMBULATORY_CARE_PROVIDER_SITE_OTHER): Payer: Self-pay | Admitting: Otolaryngology

## 2023-11-18 ENCOUNTER — Ambulatory Visit (INDEPENDENT_AMBULATORY_CARE_PROVIDER_SITE_OTHER): Admitting: Otolaryngology

## 2023-11-18 VITALS — BP 145/85 | HR 96 | Ht 64.0 in

## 2023-11-18 DIAGNOSIS — J341 Cyst and mucocele of nose and nasal sinus: Secondary | ICD-10-CM | POA: Diagnosis not present

## 2023-11-18 DIAGNOSIS — R0981 Nasal congestion: Secondary | ICD-10-CM | POA: Diagnosis not present

## 2023-11-18 DIAGNOSIS — J3489 Other specified disorders of nose and nasal sinuses: Secondary | ICD-10-CM | POA: Diagnosis not present

## 2023-11-18 DIAGNOSIS — J310 Chronic rhinitis: Secondary | ICD-10-CM

## 2023-11-18 DIAGNOSIS — J343 Hypertrophy of nasal turbinates: Secondary | ICD-10-CM | POA: Diagnosis not present

## 2023-11-18 DIAGNOSIS — J342 Deviated nasal septum: Secondary | ICD-10-CM | POA: Diagnosis not present

## 2023-11-18 MED ORDER — AZELASTINE HCL 0.1 % NA SOLN: 2.0000 | Freq: Two times a day (BID) | NASAL | 12 refills | Status: AC | PRN

## 2023-11-18 MED ORDER — FLONASE SENSIMIST 27.5 MCG/SPRAY NA SUSP: 2.0000 | Freq: Every day | NASAL | 12 refills | Status: AC

## 2023-11-18 NOTE — Progress Notes (Signed)
 Dear Dr. Purcell, Here is my assessment for our mutual patient, Traci Grant. Thank you for allowing me the opportunity to care for your patient. Please do not hesitate to contact me should you have any other questions. Sincerely, Dr. Eldora Blanch  Otolaryngology Clinic Note  HISTORY: Traci Grant is a 63 y.o. female kindly referred by Dr. Purcell for evaluation of right maxillary sinus mucous retention cyst and other nasal complaints  Initial visit (11/2023): She reports that she has had bilateral chronic nasal drainage (post nasal drip primarily) and feels like she has intermittent congestion. Associated symptoms include sneezing, and feeling like she needs to scratch her nose. Happens all the time, mostly occurs in the morning, sometimes can persist. On and off. Has not moved, does not think she has mold, no pets, no other environmental exposures. Smell and perfumes or dust or smoke can trigger it. Ongoing for years, but getting worse.  She has been on Allergy medicine - zyrtec -- sometimes it works. Has not tried sprays in the nose. She has never used the [employer]. She has known about the maxillary sinus retention cyst. No recent medication changes -- no OCP use. Denies frequent sinonasal infections requiring antibiotics. No facial pain/pressure, hyposmia, discolored drainage.   Allergy testing has not been done. No previous sinonasal surgery.  Trigeminal neuralgia has resolved  AP/AC: no  Tobacco: no  PMHx: Left Trigeminal Neuralgia  RADIOGRAPHIC EVALUATION AND INDEPENDENT REVIEW OF OTHER RECORDS:: Dr. Purcell (07/15/2023): noted right maxillary mucus retention cyst, noted on MRI in 2021; Dx: maxillary sinus cyst; Rx: ref to ENT Dr. Buck (2021) notes reviewed - left facial discomfort for several years; MRI done, feeling better.  CBC and CMP 07/15/2023: WBC 5.2, Hgb 14.2; BUN/Cr 10/0.97 MRI Face 04/13/2019 independently interpreted with to sinuses: right max likely mucous retention  cyst, does not enhance; no other sinonasal disease appreciated History reviewed. No pertinent past medical history. Past Surgical History:  Procedure Laterality Date   TUBAL LIGATION     Family History  Problem Relation Age of Onset   Hyperlipidemia Father    Hypertension Father    Glaucoma Father    Cancer Sister    Leukemia Sister    Leukemia Paternal Uncle    Social History   Tobacco Use   Smoking status: Never   Smokeless tobacco: Never  Substance Use Topics   Alcohol use: No   Allergies  Allergen Reactions   Lactose Intolerance (Gi) Diarrhea   Current Outpatient Medications  Medication Sig Dispense Refill   azelastine  (ASTELIN ) 0.1 % nasal spray Place 2 sprays into both nostrils 2 (two) times daily as needed for rhinitis. Use in each nostril as directed 30 mL 12   cetirizine (ZYRTEC) 5 MG tablet Take 5 mg by mouth daily.     fluticasone (FLONASE  SENSIMIST) 27.5 MCG/SPRAY nasal spray Place 2 sprays into the nose daily. 10 g 12   No current facility-administered medications for this visit.   BP (!) 145/85 (BP Location: Left Arm, Patient Position: Sitting, Cuff Size: Large)   Pulse 96   Ht 5' 4 (1.626 m)   SpO2 98%   BMI 33.54 kg/m   PHYSICAL EXAM:  BP (!) 145/85 (BP Location: Left Arm, Patient Position: Sitting, Cuff Size: Large)   Pulse 96   Ht 5' 4 (1.626 m)   SpO2 98%   BMI 33.54 kg/m    Salient findings:  CN II-XII intact  Bilateral EAC clear and TM intact with well pneumatized middle ear  spaces Nose: Anterior rhinoscopy reveals septum mildly deviated with bilateral inferior turbinate hypertrophy.  Nasal endoscopy was indicated to better evaluate the nose and paranasal sinuses, given the patient's history and exam findings, and is detailed below. No lesions of oral cavity/oropharynx; dentition fair No obviously palpable neck masses/lymphadenopathy/thyromegaly No respiratory distress or stridor  Head tilt test neg for rhinorrhea  PROCEDURE:  Prior  to initiating any procedures, risks/benefits/alternatives were explained to the patient and verbal consent obtained. Diagnostic Nasal Endoscopy Pre-procedure diagnosis: Nasal congestion, Rhinorrhea, Mucous retention cyst maxillary sinus Post-procedure diagnosis: same Indication: See pre-procedure diagnosis and physical exam above Complications: None apparent EBL: 0 mL Anesthesia: Lidocaine 4% and topical decongestant was topically sprayed in each nasal cavity  Description of Procedure:  Patient was identified. A rigid 30 degree endoscope was utilized to evaluate the sinonasal cavities, mucosa, sinus ostia and turbinates and septum.  Overall, signs of mucosal inflammation are not noted.  No mucopurulence, polyps, or masses noted.   Right Middle meatus: clear Right SE Recess: clear Left MM: clear Left SE Recess: clear  Photodocumentation was obtained.  CPT CODE -- 31231 - Mod 25   ASSESSMENT:  63 y.o.  1. Nasal septal deviation   2. Hypertrophy of both inferior nasal turbinates   3. Nasal congestion   4. Non-allergic rhinitis   5. Rhinorrhea   6. Mucous retention cyst of maxillary sinus    No h/o frequent infections. Main complaint is congestion and sneezing/rhinorrhea - suspect NAR here given no other exposures and first thing in morning. Has not tried medical management. Suspect retention cyst is incidental finding  PLAN: We've discussed issues and options today.  We reviewed the nasal endoscopy images together.  The risks, benefits and alternatives were discussed and questions answered.  She has elected to proceed with:  1) Flonase  BID x6 week trial; astelin  BID PRN 2) Consider daily rinses She will call if not improving in next several weeks  See below regarding exact medications prescribed this encounter including dosages and route: Meds ordered this encounter  Medications   fluticasone (FLONASE  SENSIMIST) 27.5 MCG/SPRAY nasal spray    Sig: Place 2 sprays into the nose  daily.    Dispense:  10 g    Refill:  12   azelastine  (ASTELIN ) 0.1 % nasal spray    Sig: Place 2 sprays into both nostrils 2 (two) times daily as needed for rhinitis. Use in each nostril as directed    Dispense:  30 mL    Refill:  12     Thank you for allowing me the opportunity to care for your patient. Please do not hesitate to contact me should you have any other questions.  Sincerely, Eldora Blanch, MD Otolaryngologist (ENT), [facility] Phone: [phone] Fax: [phone]  MDM:  Level 4: [phone] Complexity/Problems addressed: low Data complexity: mod - independent interpretation of notes, labs, independent MRI review - Morbidity: mod  - Drug prescribed or managed: y  11/18/2023, 2:41 PM

## 2023-11-18 NOTE — Patient Instructions (Addendum)
 Use two sprays of flonase  in each nostril daily - have to use this regularly; right after, use astelin  spray two sprays (as needed) each nostril up to twice per day

## 2024-01-08 ENCOUNTER — Encounter: Payer: Self-pay | Admitting: Emergency Medicine

## 2024-01-09 NOTE — Telephone Encounter (Signed)
**Note De-identified  [place] Obfuscation** Please advise 

## 2024-01-12 NOTE — Telephone Encounter (Signed)
 No concerns.

## 2024-07-15 ENCOUNTER — Encounter: Admitting: Emergency Medicine

## 2024-07-20 ENCOUNTER — Encounter: Admitting: Emergency Medicine
